# Patient Record
Sex: Female | Born: 1995
Health system: Southern US, Community
[De-identification: ages and names within clinical notes are randomized; demographics above are authoritative.]

## PROBLEM LIST (undated history)

## (undated) DIAGNOSIS — D649 Anemia, unspecified: Secondary | ICD-10-CM

## (undated) HISTORY — DX: Anemia, unspecified: D64.9

---
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
## 2014-04-07 LAB — OB RESULTS CONSOLE RPR: RPR: NONREACTIVE

## 2014-04-07 LAB — OB RESULTS CONSOLE HIV ANTIBODY (ROUTINE TESTING): HIV: NONREACTIVE

## 2014-04-07 LAB — OB RESULTS CONSOLE ABO/RH: RH Type: NEGATIVE

## 2014-04-07 LAB — OB RESULTS CONSOLE GC/CHLAMYDIA
Chlamydia: NEGATIVE
GC PROBE AMP, GENITAL: NEGATIVE

## 2014-04-07 LAB — OB RESULTS CONSOLE HEPATITIS B SURFACE ANTIGEN: Hepatitis B Surface Ag: NEGATIVE

## 2014-04-07 LAB — OB RESULTS CONSOLE RUBELLA ANTIBODY, IGM: RUBELLA: IMMUNE

## 2014-06-04 NOTE — L&D Delivery Note (Signed)
Delivery Note   At 4:51 AM a viable female  [name] was delivered via Vaginal, Spontaneous Delivery  (Presentation: ; Occiput Anterior).  APGAR: 9, 9 Placenta status:complete and spontaneous  Cord: 3 vessels   Anesthesia: Epidural  Episiotomy: None Lacerations: 2nd degree Suture Repair: 3.0 Monocryl Est. Blood Loss (mL):  200  Mom to postpartum.  Baby to Couplet care / Skin to Skin.  [name] A 10/15/2014, 5:21 AM

## 2014-09-08 LAB — OB RESULTS CONSOLE GBS: STREP GROUP B AG: NEGATIVE

## 2014-10-14 ENCOUNTER — Other Ambulatory Visit: Payer: Self-pay | Admitting: Obstetrics and Gynecology

## 2014-10-14 ENCOUNTER — Inpatient Hospital Stay (HOSPITAL_COMMUNITY)
Admission: AD | Admit: 2014-10-14 | Discharge: 2014-10-17 | DRG: 775 | Disposition: A | Discharge status: Home / Self Care | Payer: 59 | Source: Ambulatory Visit | Attending: Obstetrics and Gynecology | Admitting: Obstetrics and Gynecology

## 2014-10-14 ENCOUNTER — Encounter (HOSPITAL_COMMUNITY): Payer: Self-pay | Admitting: *Deleted

## 2014-10-14 DIAGNOSIS — Z3403 Encounter for supervision of normal first pregnancy, third trimester: Secondary | ICD-10-CM | POA: Diagnosis present

## 2014-10-14 DIAGNOSIS — O480 Post-term pregnancy: Principal | ICD-10-CM | POA: Diagnosis present

## 2014-10-14 DIAGNOSIS — O9902 Anemia complicating childbirth: Secondary | ICD-10-CM | POA: Diagnosis present

## 2014-10-14 DIAGNOSIS — Z3A41 41 weeks gestation of pregnancy: Secondary | ICD-10-CM | POA: Diagnosis present

## 2014-10-14 DIAGNOSIS — O26899 Other specified pregnancy related conditions, unspecified trimester: Secondary | ICD-10-CM | POA: Diagnosis present

## 2014-10-14 DIAGNOSIS — D649 Anemia, unspecified: Secondary | ICD-10-CM | POA: Diagnosis present

## 2014-10-14 DIAGNOSIS — Z6791 Unspecified blood type, Rh negative: Secondary | ICD-10-CM | POA: Diagnosis present

## 2014-10-14 DIAGNOSIS — Z889 Allergy status to unspecified drugs, medicaments and biological substances status: Secondary | ICD-10-CM

## 2014-10-14 DIAGNOSIS — F988 Other specified behavioral and emotional disorders with onset usually occurring in childhood and adolescence: Secondary | ICD-10-CM | POA: Diagnosis present

## 2014-10-14 LAB — CBC
HCT: 34.4 % — ABNORMAL LOW (ref 36.0–46.0)
HEMOGLOBIN: 11.4 g/dL — AB (ref 12.0–15.0)
MCH: 26.5 pg (ref 26.0–34.0)
MCHC: 33.1 g/dL (ref 30.0–36.0)
MCV: 79.8 fL (ref 78.0–100.0)
Platelets: 299 10*3/uL (ref 150–400)
RBC: 4.31 MIL/uL (ref 3.87–5.11)
RDW: 14.1 % (ref 11.5–15.5)
WBC: 12.4 10*3/uL — ABNORMAL HIGH (ref 4.0–10.5)

## 2014-10-14 LAB — TYPE AND SCREEN
ABO/RH(D): O NEG
Antibody Screen: NEGATIVE

## 2014-10-14 MED ORDER — LACTATED RINGERS IV SOLN
500.0000 mL | INTRAVENOUS | Status: DC | PRN
  Administered 2014-10-15: 400 mL via INTRAVENOUS

## 2014-10-14 MED ORDER — OXYTOCIN BOLUS FROM INFUSION: 500.0000 mL | INTRAVENOUS | Status: DC

## 2014-10-14 MED ORDER — OXYCODONE-ACETAMINOPHEN 5-325 MG PO TABS
1.0000 | ORAL_TABLET | ORAL | Status: DC | PRN
  Filled 2014-10-14: qty 1

## 2014-10-14 MED ORDER — FLEET ENEMA 7-19 GM/118ML RE ENEM: 1.0000 | ENEMA | RECTAL | Status: DC | PRN

## 2014-10-14 MED ORDER — ONDANSETRON HCL 4 MG/2ML IJ SOLN: 4.0000 mg | Freq: Four times a day (QID) | INTRAMUSCULAR | Status: DC | PRN

## 2014-10-14 MED ORDER — BUTORPHANOL TARTRATE 1 MG/ML IJ SOLN
1.0000 mg | INTRAMUSCULAR | Status: DC | PRN
  Administered 2014-10-15: 1 mg via INTRAVENOUS
  Filled 2014-10-14: qty 1

## 2014-10-14 MED ORDER — OXYCODONE-ACETAMINOPHEN 5-325 MG PO TABS: 2.0000 | ORAL_TABLET | ORAL | Status: DC | PRN

## 2014-10-14 MED ORDER — CITRIC ACID-SODIUM CITRATE 334-500 MG/5ML PO SOLN: 30.0000 mL | ORAL | Status: DC | PRN

## 2014-10-14 MED ORDER — LIDOCAINE HCL (PF) 1 % IJ SOLN
30.0000 mL | INTRAMUSCULAR | Status: DC | PRN
  Administered 2014-10-15: 30 mL via SUBCUTANEOUS
  Filled 2014-10-14: qty 30

## 2014-10-14 MED ORDER — LACTATED RINGERS IV SOLN
INTRAVENOUS | Status: DC
  Administered 2014-10-15: 04:00:00 via INTRAVENOUS

## 2014-10-14 MED ORDER — ACETAMINOPHEN 325 MG PO TABS: 650.0000 mg | ORAL_TABLET | ORAL | Status: DC | PRN

## 2014-10-14 MED ORDER — OXYTOCIN 40 UNITS IN LACTATED RINGERS INFUSION - SIMPLE MED
62.5000 mL/h | INTRAVENOUS | Status: DC
  Filled 2014-10-14: qty 1000

## 2014-10-14 NOTE — Progress Notes (Signed)
Lori SnookDanielle Perry MRN: [id number]  Subjective: -Patient resting in bed.  Mother, Gigi Gineggy, and FOB, Garald BraverKenrick, at bedside.  Patient reports intermittent lower abdominal pain.  Objective: BP 109/73 mmHg  Pulse 114  Temp(Src) 98.4 F (36.9 C) (Oral)  Resp 18  Ht 5\' 5"  (1.651 m)  Wt 70.761 kg (156 lb)  BMI 25.96 kg/m2     FHT: 150 bpm, Mod Var, -Decels, +Accels UC: Q1-474min, palpates moderate   SVE:   Dilation: 3 Effacement (%): 80 Station: -2 Exam by:: Manfred ArchV Latham CNM Membranes:Intact Pitocin:None  Assessment:  IUP at 41.2wks Cat I FT  PostDates GBS Negative  Plan: -Discussed POC for tonight including VE and AROM at 10pm -Informed of IV pain medication availability -No questions or concerns -Continue other mgmt as ordered  Hildreth Orsak LYNN,MSN, CNM 10/14/2014, 7:47 PM

## 2014-10-14 NOTE — H&P (Signed)
Lori Perry is a 19 y.o. female, G1P0 at 8441 1/7 weeks, presenting from the office in early labor--had reactive NST there around 4pm, with UCs q 3-10 min noted, cervix 3 cm, 90%, vtx, -1.    Patient Active Problem List   Diagnosis Date Noted  . Normal labor 10/14/2014  . 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 ADD (attention deficit disorder) 10/14/2014  . 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 Anemia 10/14/2014    History of present pregnancy: Patient entered care at 13 6/7 weeks.   EDC of 10/05/14 was established by US at 17 6/7 weeks, due to 10 day discrepancy in LMP and US dating.   Anatomy scan:  19 2/7 weeks, with normal findings and an low lying placenta.   Additional US evaluations:   28 2/7 weeks:  EFW 2+9, 28.8%ile, LLP resolved, normal fluid 34 2/7 weeks:  EFW 5+5, 47.4%ile, anterior placenta, AFI 14.37, 50%ile, vtx.   Significant prenatal events:  Had LLP on anatomy US, resolved on 28 week US.  100K Ecoli and enterococcus noted on NOB urine culture, with Macrobid Rx'd--patient had rxn to Macrobid with swelling.  Retreated with Ampicillin--no TOC done.  On Fe during pregnancy due to Hgb 10.9 at 28 weeks.  Received Rhophylac 07/29/14 and TDAP 07/08/14. Last evaluation:  10/14/14--cervix 3 cm, 90%, vtx, -1, BP 132/92.  NST was reactive, with UCs q 3 min.  [facility]  OB History    Gravida Para Term Preterm AB TAB SAB Ectopic Multiple Living   1              Medical Hx:  ADD dx age 89.  Occasional urinary frequency. Surgical Hx:  Wisdom teeth Family History:  MGF CVA, prostate Ca; Mother HTN, asthma; MGM cancer; PGM cancer  Social History:  has no tobacco, alcohol, and drug history on file.  Patient is Caucasian, of the [place] faith, high school educated.  She is single, with FOB Helane Gunther(Kenrick Gariba) involved and supportive.  Patient is employed as a Conservation officer, naturecashier.   Prenatal Transfer Tool  Maternal Diabetes: No Genetic Screening: Normal Quad screen Maternal  Ultrasounds/Referrals: Normal Fetal Ultrasounds or other Referrals:  None Maternal Substance Abuse:  No Significant Maternal Medications:  None Significant Maternal Lab Results: Lab values include: Group B Strep negative, Rh negative  TDAP 07/08/14 Flu NA  ROS:  Contractions, +FM  Allergies  Allergen Reactions  . Sulfa Antibiotics Rash     Dilation: 3 Effacement (%): 80 Station: -2 Exam by:: V Iliza Blankenbeckler CNM Blood pressure 95/77, pulse 84, temperature 98.2 F (36.8 C), temperature source Oral, resp. rate 20, height 5\' 5"  (1.651 m), weight 70.761 kg (156 lb).  Chest clear Heart RRR without murmur Abd gravid, NT, FH 39 Pelvic: 3 cm, 80%, vtx, -2, IBOW, small amount bloody show Ext: WNL  FHR: Category 2 in first 5 min--no decels, decreased variability UCs:  Irregular, mild  Prenatal labs: ABO, Rh: --/--/O NEG (05/12 1925)O neg Antibody: NEG (05/12 1925)Neg Rubella:   Immune RPR: Nonreactive (11/04 0000) NR HBsAg: Negative (11/04 0000)Neg  HIV: Non-reactive (11/04 0000) NR GBS: Negative (04/06 0000) Sickle cell/Hgb electrophoresis:  NA Pap:  NA due to age GC:  Negative 04/08/15 and 09/08/14 Chlamydia:  Negative 04/08/15 and 09/08/14 Genetic screenings:  Normal Quad screen Glucola:  WNL Other:   Hgb 12.9 at NOB, 10.3 at 28 weeks       Assessment/Plan: IUP at 41 1/7 weeks Early labor GBS negative Rh negative  [facility] Dr. Su Hiltoberts Routine [facility] orders Pain med/epidural prn--prefers IV med only.   Nyra CapesLATHAM, VICKICNM, [place] 10/14/2014, 9:15 PM

## 2014-10-14 NOTE — Progress Notes (Signed)
Daleen SnookDanielle Keasling MRN: [id number]  Subjective: -Patient reporting discomfort with contractions.  Declines IV pain medication at current.  Mother and FOB remain supportive at bedside.  Objective: BP 95/77 mmHg  Pulse 84  Temp(Src) 98.2 F (36.8 C) (Oral)  Resp 20  Ht 5\' 5"  (1.651 m)  Wt 70.761 kg (156 lb)  BMI 25.96 kg/m2     FHT: 150 bpm, Mod Var, -Decels, +Accels UC: Q3-645min, palpates moderate   SVE:   Dilation: 3 Effacement (%): 90 Station: -2 Exam by:: Caylin Nass CNM Membranes:AROM at 2300--Moderate Amt Green Meconium Pitocin:None  Assessment:  IUP at 41.2wks Cat I FT  Early Labor Augmentation: AROM  Plan: -Discussed pain medication options -Patient informed of need for monitoring for 30minutes before ambulation -Continue other mgmt as ordered  Letrell Attwood LYNN,MSN, CNM 10/14/2014, 11:08 PM

## 2014-10-15 ENCOUNTER — Encounter (HOSPITAL_COMMUNITY): Payer: Self-pay | Admitting: *Deleted

## 2014-10-15 ENCOUNTER — Inpatient Hospital Stay (HOSPITAL_COMMUNITY): Payer: 59 | Admitting: Anesthesiology

## 2014-10-15 LAB — ABO/RH: ABO/RH(D): O NEG

## 2014-10-15 LAB — RPR: RPR Ser Ql: NONREACTIVE

## 2014-10-15 LAB — HIV ANTIBODY (ROUTINE TESTING W REFLEX): HIV SCREEN 4TH GENERATION: NONREACTIVE

## 2014-10-15 MED ORDER — DIPHENHYDRAMINE HCL 50 MG/ML IJ SOLN: 12.5000 mg | INTRAMUSCULAR | Status: DC | PRN

## 2014-10-15 MED ORDER — OXYTOCIN 40 UNITS IN LACTATED RINGERS INFUSION - SIMPLE MED: 62.5000 mL/h | INTRAVENOUS | Status: DC | PRN

## 2014-10-15 MED ORDER — ONDANSETRON HCL 4 MG PO TABS: 4.0000 mg | ORAL_TABLET | ORAL | Status: DC | PRN

## 2014-10-15 MED ORDER — OXYCODONE-ACETAMINOPHEN 5-325 MG PO TABS
1.0000 | ORAL_TABLET | ORAL | Status: DC | PRN
  Administered 2014-10-15 – 2014-10-17 (×6): 1 via ORAL
  Filled 2014-10-15 (×5): qty 1

## 2014-10-15 MED ORDER — OXYTOCIN 40 UNITS IN LACTATED RINGERS INFUSION - SIMPLE MED
INTRAVENOUS | Status: AC
  Filled 2014-10-15: qty 1000

## 2014-10-15 MED ORDER — FENTANYL 2.5 MCG/ML BUPIVACAINE 1/10 % EPIDURAL INFUSION (WH - ANES)
14.0000 mL/h | INTRAMUSCULAR | Status: DC | PRN
Start: 2014-10-15 — End: 2014-10-15

## 2014-10-15 MED ORDER — PRENATAL MULTIVITAMIN CH
1.0000 | ORAL_TABLET | Freq: Every day | ORAL | Status: DC
  Administered 2014-10-15 – 2014-10-17 (×3): 1 via ORAL
  Filled 2014-10-15 (×3): qty 1

## 2014-10-15 MED ORDER — BENZOCAINE-MENTHOL 20-0.5 % EX AERO
1.0000 "application " | INHALATION_SPRAY | CUTANEOUS | Status: DC | PRN
  Filled 2014-10-15 (×3): qty 56

## 2014-10-15 MED ORDER — FENTANYL 2.5 MCG/ML BUPIVACAINE 1/10 % EPIDURAL INFUSION (WH - ANES)
14.0000 mL/h | INTRAMUSCULAR | Status: DC | PRN
  Administered 2014-10-15 (×2): 14 mL/h via EPIDURAL

## 2014-10-15 MED ORDER — SIMETHICONE 80 MG PO CHEW: 80.0000 mg | CHEWABLE_TABLET | ORAL | Status: DC | PRN

## 2014-10-15 MED ORDER — WITCH HAZEL-GLYCERIN EX PADS: 1.0000 "application " | MEDICATED_PAD | CUTANEOUS | Status: DC | PRN

## 2014-10-15 MED ORDER — LANOLIN HYDROUS EX OINT: TOPICAL_OINTMENT | CUTANEOUS | Status: DC | PRN

## 2014-10-15 MED ORDER — PHENYLEPHRINE 40 MCG/ML (10ML) SYRINGE FOR IV PUSH (FOR BLOOD PRESSURE SUPPORT)
PREFILLED_SYRINGE | INTRAVENOUS | Status: AC
  Filled 2014-10-15: qty 20

## 2014-10-15 MED ORDER — IBUPROFEN 600 MG PO TABS
600.0000 mg | ORAL_TABLET | Freq: Four times a day (QID) | ORAL | Status: DC
  Administered 2014-10-15 – 2014-10-17 (×9): 600 mg via ORAL
  Filled 2014-10-15 (×9): qty 1

## 2014-10-15 MED ORDER — ZOLPIDEM TARTRATE 5 MG PO TABS: 5.0000 mg | ORAL_TABLET | Freq: Every evening | ORAL | Status: DC | PRN

## 2014-10-15 MED ORDER — DIBUCAINE 1 % RE OINT: 1.0000 "application " | TOPICAL_OINTMENT | RECTAL | Status: DC | PRN

## 2014-10-15 MED ORDER — LIDOCAINE HCL (PF) 1 % IJ SOLN
INTRAMUSCULAR | Status: DC | PRN
  Administered 2014-10-15: 4 mL
  Administered 2014-10-15: 6 mL

## 2014-10-15 MED ORDER — TETANUS-DIPHTH-ACELL PERTUSSIS 5-2.5-18.5 LF-MCG/0.5 IM SUSP: 0.5000 mL | Freq: Once | INTRAMUSCULAR | Status: DC

## 2014-10-15 MED ORDER — PHENYLEPHRINE 40 MCG/ML (10ML) SYRINGE FOR IV PUSH (FOR BLOOD PRESSURE SUPPORT)
80.0000 ug | PREFILLED_SYRINGE | INTRAVENOUS | Status: DC | PRN
  Filled 2014-10-15: qty 2

## 2014-10-15 MED ORDER — SENNOSIDES-DOCUSATE SODIUM 8.6-50 MG PO TABS
2.0000 | ORAL_TABLET | ORAL | Status: DC
  Administered 2014-10-15 – 2014-10-16 (×2): 2 via ORAL
  Filled 2014-10-15 (×2): qty 2

## 2014-10-15 MED ORDER — FENTANYL 2.5 MCG/ML BUPIVACAINE 1/10 % EPIDURAL INFUSION (WH - ANES)
INTRAMUSCULAR | Status: AC
  Filled 2014-10-15: qty 125

## 2014-10-15 MED ORDER — ACETAMINOPHEN 325 MG PO TABS: 650.0000 mg | ORAL_TABLET | ORAL | Status: DC | PRN

## 2014-10-15 MED ORDER — DIPHENHYDRAMINE HCL 25 MG PO CAPS: 25.0000 mg | ORAL_CAPSULE | Freq: Four times a day (QID) | ORAL | Status: DC | PRN

## 2014-10-15 MED ORDER — FERROUS SULFATE 325 (65 FE) MG PO TABS
325.0000 mg | ORAL_TABLET | Freq: Two times a day (BID) | ORAL | Status: DC
Start: 2014-10-15 — End: 2014-10-17
  Administered 2014-10-15 – 2014-10-17 (×4): 325 mg via ORAL
  Filled 2014-10-15 (×4): qty 1

## 2014-10-15 MED ORDER — EPHEDRINE 5 MG/ML INJ
10.0000 mg | INTRAVENOUS | Status: DC | PRN
  Filled 2014-10-15: qty 2

## 2014-10-15 MED ORDER — ONDANSETRON HCL 4 MG/2ML IJ SOLN: 4.0000 mg | INTRAMUSCULAR | Status: DC | PRN

## 2014-10-15 MED ORDER — OXYCODONE-ACETAMINOPHEN 5-325 MG PO TABS: 2.0000 | ORAL_TABLET | ORAL | Status: DC | PRN

## 2014-10-15 MED ORDER — MEASLES, MUMPS & RUBELLA VAC ~~LOC~~ INJ
0.5000 mL | INJECTION | Freq: Once | SUBCUTANEOUS | Status: DC
  Filled 2014-10-15: qty 0.5

## 2014-10-15 NOTE — Anesthesia Postprocedure Evaluation (Signed)
Anesthesia Post Note  Patient: Lori Perry  Procedure(s) Performed: * No procedures listed *  Anesthesia type: Epidural  Patient location: Mother/Baby  Post pain: Pain level controlled  Post assessment: Post-op Vital signs reviewed  Last Vitals:  Filed Vitals:   10/15/14 1825  BP: 113/66  Pulse: 85  Temp: 36.7 C  Resp: 18    Post vital signs: Reviewed  Level of consciousness:alert  Complications: No apparent anesthesia complications

## 2014-10-15 NOTE — Progress Notes (Signed)
Dr Estanislado Pandyivard arrived at bedside.

## 2014-10-15 NOTE — Anesthesia Preprocedure Evaluation (Signed)

## 2014-10-15 NOTE — Progress Notes (Signed)
Patient complete. Requesting to rest.

## 2014-10-15 NOTE — Anesthesia Procedure Notes (Signed)

## 2014-10-16 ENCOUNTER — Encounter (HOSPITAL_COMMUNITY): Payer: Self-pay | Admitting: Obstetrics and Gynecology

## 2014-10-16 LAB — CBC
HEMATOCRIT: 28.8 % — AB (ref 36.0–46.0)
HEMOGLOBIN: 9.3 g/dL — AB (ref 12.0–15.0)
MCH: 26.1 pg (ref 26.0–34.0)
MCHC: 32.3 g/dL (ref 30.0–36.0)
MCV: 80.9 fL (ref 78.0–100.0)
Platelets: 223 10*3/uL (ref 150–400)
RBC: 3.56 MIL/uL — ABNORMAL LOW (ref 3.87–5.11)
RDW: 14.6 % (ref 11.5–15.5)
WBC: 12.5 10*3/uL — AB (ref 4.0–10.5)

## 2014-10-16 MED ORDER — RHO D IMMUNE GLOBULIN 1500 UNIT/2ML IJ SOSY
300.0000 ug | PREFILLED_SYRINGE | Freq: Once | INTRAMUSCULAR | Status: AC
  Administered 2014-10-16: 300 ug via INTRAVENOUS
  Filled 2014-10-16: qty 2

## 2014-10-16 NOTE — Progress Notes (Signed)
  Subjective: Postpartum Day 1: Vaginal delivery, 2nd degree perineal laceration Patient up ad lib, reports no syncope or dizziness. Feeding:  Breast Contraceptive plan:  Wants OCPs  Baby Rh +--mother awaiting Rhophylac today. Wants circ--will likely do as outpatient.  Objective: Vital signs in last 24 hours: Temp:  [97.6 F (36.4 C)-98.3 F (36.8 C)] 98.3 F (36.8 C) (05/14 0538) Pulse Rate:  [77-85] 79 (05/14 0538) Resp:  [16-18] 18 (05/14 0538) BP: (96-113)/(48-66) 96/48 mmHg (05/14 0538) SpO2:  [99 %-100 %] 100 % (05/13 2000)  Physical Exam:  General: alert Lochia: appropriate Uterine Fundus: firm Perineum: healing well DVT Evaluation: No evidence of DVT seen on physical exam. Negative Homan's sign.  CBC Latest Ref Rng 10/16/2014 10/14/2014 07/14/2010  WBC 4.0 - 10.5 K/uL 12.5(H) 12.4(H) 9.4  Hemoglobin 12.0 - 15.0 g/dL 1.6(X9.3(L) 11.4(L) 12.5  Hematocrit 36.0 - 46.0 % 28.8(L) 34.4(L) 37.3  Platelets 150 - 400 K/uL 223 299 315     Assessment/Plan: Status post vaginal delivery day 1. Stable Continue current care. Plan for discharge tomorrow  Reviewed OCP use, with need for progesterone only pill due to breastfeeding.    Nyra CapesLATHAM, VICKICNM 10/16/2014, 9:32 AM

## 2014-10-17 LAB — RH IG WORKUP (INCLUDES ABO/RH)
ABO/RH(D): O NEG
Fetal Screen: NEGATIVE
GESTATIONAL AGE(WKS): 41
UNIT DIVISION: 0

## 2014-10-17 MED ORDER — NORETHINDRONE 0.35 MG PO TABS: 1.0000 | ORAL_TABLET | Freq: Every day | ORAL | Status: DC

## 2014-10-17 MED ORDER — LEVONORGESTREL-ETHINYL ESTRAD 0.1-20 MG-MCG PO TABS: 1.0000 | ORAL_TABLET | Freq: Every day | ORAL | Status: DC

## 2014-10-17 MED ORDER — OXYCODONE-ACETAMINOPHEN 5-325 MG PO TABS: 1.0000 | ORAL_TABLET | ORAL | Status: DC | PRN

## 2014-10-17 MED ORDER — FERROUS SULFATE 325 (65 FE) MG PO TABS: 325.0000 mg | ORAL_TABLET | Freq: Every day | ORAL | Status: DC

## 2014-10-17 MED ORDER — IBUPROFEN 600 MG PO TABS: 600.0000 mg | ORAL_TABLET | Freq: Four times a day (QID) | ORAL | Status: DC | PRN

## 2014-10-17 NOTE — Lactation Note (Signed)
This note was copied from the chart of Lori Perry Belding. Lactation Consultation Note  Patient Name: Lori Perry Ezelle QIONG'EToday's Date: 10/17/2014 Reason for consult: Follow-up assessment  Mom has decided to change to formula/bottle feeding. Does not want to pump/bottle. Reviewed how to dry her milk should it come in. Advised of OP services.  Maternal Data    Feeding Feeding Type: Bottle Fed - Formula Nipple Type: Slow - flow  LATCH Score/Interventions                      Lactation Tools Discussed/Used     Consult Status Consult Status: Complete Date: 10/17/14 Follow-up type: In-patient    Alfred LevinsGranger, Everlina Gotts Ann 10/17/2014, 11:05 AM

## 2014-10-17 NOTE — Discharge Instructions (Signed)

## 2014-10-17 NOTE — Discharge Summary (Signed)
  Vaginal Delivery Discharge Summary  Lori SnookDanielle Perry  DOB:    01/05/1996 MRN:    [id number] CSN:    [id number]  Date of admission:                  10/14/14  Date of discharge:                   10/17/14  Procedures this admission:   SVB, repair of 2nd degree laceration  Date of Delivery: 10/15/14  Newborn Data:  Live born female  Birth Weight: 8 lb 4.2 oz (3748 g) APGAR: 9, 9  Home with mother. Name: Erskine SquibbKaiden Circumcision Plan: Outpatient  History of Present Illness:  Ms. Lori SnookDanielle Belle is a 19 y.o. female, G1P1001, who presents at 5267w3d weeks gestation. [facility] She was admitted onset of labor. Her pregnancy has been complicated by:  Patient Active Problem List   Diagnosis Date Noted  . Vaginal delivery 10/15/2014  . 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 ADD (attention deficit disorder) 10/14/2014  . 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 Anemia 10/14/2014     Hospital Course:  Admitted 10/14/14 in early labor.  Negative GBS. Progressed with AROM Utilized epidural for pain management.  Delivery was performed by Dr. Estanislado Pandyivard without complication. Patient and baby tolerated the procedure without difficulty, with 2nd degree perineal laceration noted. Infant status was stable and remained in room with mother.  Mother and infant then had an uncomplicated postpartum course, with patient initially planning to breastfeed, but then deciding to bottle feed. Mom's physical exam was WNL, and she was discharged home in stable condition. Contraception plan was Micronor.  She received adequate benefit from po pain medications, using Motrin and Percocet, also Rx'd Fe q day.  Rx for Orsythia OCP .   Feeding:  breast  Contraception:  oral contraceptives (estrogen/progesterone)  Hemoglobin Results:  CBC Latest Ref Rng 10/16/2014 10/14/2014 07/14/2010  WBC 4.0 - 10.5 K/uL  12.5(H) 12.4(H) 9.4  Hemoglobin 12.0 - 15.0 g/dL 7.8(G9.3(L) 11.4(L) 12.5  Hematocrit 36.0 - 46.0 % 28.8(L) 34.4(L) 37.3  Platelets 150 - 400 K/uL 223 299 315     Discharge Physical Exam:   General: alert Lochia: appropriate Uterine Fundus: firm Incision: healing well DVT Evaluation: No evidence of DVT seen on physical exam. Negative Homan's sign.  Intrapartum Procedures: spontaneous vaginal delivery Postpartum Procedures: Rho(D) Ig Complications-Operative and Postpartum: 2nd degree perineal laceration  Discharge Diagnoses: Term Pregnancy-delivered and anemia  Discharge Information:  Activity:           pelvic rest Diet:                routine Medications: Ibuprofen, Iron, Percocet and Orsythia OCP Condition:      stable Instructions:     Discharge to: home  Follow-up Information    [facility]. Schedule an appointment as soon as possible for a visit in 6 weeks.   Specialty:  Obstetrics and Gynecology   Why:  Call [facility] to schedule baby's circumcision.  Call for any questions or concerns.   Contact information:   [address] [place] [phone]       Nigel BridgemanLATHAM, Bawi Lakins [facility] 10/17/2014 10:28 AM

## 2014-10-17 NOTE — Progress Notes (Signed)
While speaking with patient regarding breastfeeding or pumping and putting breast milk in bottle, she became tearful and told me that she does not want to do any of it; only formula feed.  Education had been provided regarding how often to pump or latch, engorgement treatment if she became engorged, etc.  Patient was provided emotional support and was reassured that her decision was ok.  Will continue to monitor and provide support

## 2015-04-26 ENCOUNTER — Emergency Department (HOSPITAL_BASED_OUTPATIENT_CLINIC_OR_DEPARTMENT_OTHER): Payer: 59

## 2015-04-26 ENCOUNTER — Encounter (INDEPENDENT_AMBULATORY_CARE_PROVIDER_SITE_OTHER): Payer: Self-pay

## 2015-04-26 ENCOUNTER — Emergency Department (HOSPITAL_BASED_OUTPATIENT_CLINIC_OR_DEPARTMENT_OTHER)
Admission: EM | Admit: 2015-04-26 | Discharge: 2015-04-26 | Disposition: A | Discharge status: Home / Self Care | Payer: 59 | Attending: Emergency Medicine | Admitting: Emergency Medicine

## 2015-04-26 ENCOUNTER — Emergency Department: Payer: 59

## 2015-04-26 ENCOUNTER — Encounter (HOSPITAL_BASED_OUTPATIENT_CLINIC_OR_DEPARTMENT_OTHER): Payer: Self-pay | Admitting: Emergency Medicine

## 2015-04-26 DIAGNOSIS — Z793 Long term (current) use of hormonal contraceptives: Secondary | ICD-10-CM | POA: Insufficient documentation

## 2015-04-26 DIAGNOSIS — S92402A Displaced unspecified fracture of left great toe, initial encounter for closed fracture: Secondary | ICD-10-CM

## 2015-04-26 DIAGNOSIS — Y998 Other external cause status: Secondary | ICD-10-CM | POA: Diagnosis not present

## 2015-04-26 DIAGNOSIS — S0003XA Contusion of scalp, initial encounter: Secondary | ICD-10-CM | POA: Diagnosis not present

## 2015-04-26 DIAGNOSIS — Y939 Activity, unspecified: Secondary | ICD-10-CM | POA: Diagnosis not present

## 2015-04-26 DIAGNOSIS — S060X1A Concussion with loss of consciousness of 30 minutes or less, initial encounter: Secondary | ICD-10-CM | POA: Insufficient documentation

## 2015-04-26 DIAGNOSIS — Y92009 Unspecified place in unspecified non-institutional (private) residence as the place of occurrence of the external cause: Secondary | ICD-10-CM | POA: Insufficient documentation

## 2015-04-26 DIAGNOSIS — S0990XA Unspecified injury of head, initial encounter: Secondary | ICD-10-CM | POA: Diagnosis present

## 2015-04-26 DIAGNOSIS — S92412A Displaced fracture of proximal phalanx of left great toe, initial encounter for closed fracture: Secondary | ICD-10-CM | POA: Diagnosis not present

## 2015-04-26 DIAGNOSIS — W19XXXA Unspecified fall, initial encounter: Secondary | ICD-10-CM | POA: Insufficient documentation

## 2015-04-26 DIAGNOSIS — S90412A Abrasion, left great toe, initial encounter: Secondary | ICD-10-CM | POA: Diagnosis not present

## 2015-04-26 DIAGNOSIS — Z79899 Other long term (current) drug therapy: Secondary | ICD-10-CM | POA: Diagnosis not present

## 2015-04-26 DIAGNOSIS — Z880 Allergy status to penicillin: Secondary | ICD-10-CM | POA: Diagnosis not present

## 2015-04-26 MED ORDER — ACETAMINOPHEN 325 MG PO TABS
650.0000 mg | ORAL_TABLET | Freq: Once | ORAL | Status: AC
  Administered 2015-04-26: 650 mg via ORAL
  Filled 2015-04-26: qty 2

## 2015-04-26 NOTE — ED Notes (Signed)
Pt found on the floor at home at approx 8:30 pm by boyfriend, who had been in a different part of the house.  Pt has no recollection of the incident.  Boyfriend is not present.  Pt brought in by mother.  Pt does not know if she passed out and fell or if she fell and hit her head and passed out. C/O pain and tenderness to right temple area. Also pain to left great toe. Pt vomited 2-3 times after her fall. No vomiting at this time.

## 2015-04-26 NOTE — ED Provider Notes (Signed)
CSN: [id number]     Arrival date & time 04/26/15  0209 History   First MD Initiated Contact with Patient 04/26/15 0248     Chief Complaint  Patient presents with  . Fall     (Consider location/radiation/quality/duration/timing/severity/associated sxs/prior Treatment) HPI  This is a 19 year old female who either fell or passed out about 8:15 yesterday evening. She was found by her boyfriend. When he found her she was tremulous but conscious. She does not remember the fall. She subsequently vomited twice but is no longer vomiting or nauseated. She is complaining of a moderate headache with a tender right parietal hematoma. She also has ecchymosis, pain and abrasions to the left great toe. She has had difficulty answering certain questions such as day and date. She denies neck pain or other injury apart from those noted above.    Past Medical History  Diagnosis Date  . Normal labor 10/14/2014   History reviewed. No pertinent past surgical history. No family history on file. Social History  Substance Use Topics  . Smoking status: Never Smoker   . Smokeless tobacco: None  . Alcohol Use: No   OB History    Gravida Para Term Preterm AB TAB SAB Ectopic Multiple Living   1 1 1       0 1     Review of Systems  All other systems reviewed and are negative.   Allergies  Amoxicillin and Sulfa antibiotics  Home Medications   Prior to Admission medications   Medication Sig Start Date End Date Taking? Authorizing Provider  ferrous sulfate 325 (65 FE) MG tablet Take 1 tablet (325 mg total) by mouth daily with breakfast. 10/17/14   Nigel BridgemanVicki Latham, CNM  ibuprofen (ADVIL,MOTRIN) 600 MG tablet Take 1 tablet (600 mg total) by mouth every 6 (six) hours as needed. 10/17/14   Nigel BridgemanVicki Latham, CNM  levonorgestrel-ethinyl estradiol (ORSYTHIA) 0.1-20 MG-MCG tablet Take 1 tablet by mouth daily. 10/17/14   Nigel BridgemanVicki Latham, CNM  oxyCODONE-acetaminophen (PERCOCET/ROXICET) 5-325 MG per tablet Take 1 tablet by mouth  every 4 (four) hours as needed (for pain scale 4-7). 10/17/14   Nigel BridgemanVicki Latham, CNM  Prenatal Vit-Fe Fumarate-FA (PRENATAL MULTIVITAMIN) TABS tablet Take 1 tablet by mouth daily at 12 noon.    Historical Provider, MD   BP 103/61 mmHg  Pulse 98  Temp(Src) 98.3 F (36.8 C) (Oral)  Resp 18  Ht 5\' 6"  (1.676 m)  Wt 138 lb (62.596 kg)  BMI 22.28 kg/m2  SpO2 100%  LMP  (Approximate)  Breastfeeding? No   Physical Exam  General: Well-developed, well-nourished female in no acute distress; appearance consistent with age of record HENT: normocephalic; small right parietal hematoma; no hemotympanum Eyes: pupils equal, round and reactive to light; extraocular muscles intact Neck: supple; no C-spine tenderness Heart: regular rate and rhythm Lungs: clear to auscultation bilaterally Abdomen: soft; nondistended; nontender; bowel sounds present Back: No spinal tenderness Extremities: No deformity; full range of motion; pulses normal; tenderness, ecchymosis and abrasions to left great toe:   Neurologic: Awake, alert and oriented to person, place and year, disoriented to day, date and month, had to be reoriented to current POTUS and POTUS-elect; motor function intact in all extremities and symmetric; no facial droop Skin: Warm and dry Psychiatric: Flat affect    ED Course  Procedures (including critical care time)   MDM  Nursing notes and vitals signs, including pulse oximetry, reviewed.  Summary of this visit's results, reviewed by myself:   EKG Interpretation  Date/Time:  Tuesday April 26 2015 04:10:23 EST Ventricular Rate:  86 PR Interval:  112 QRS Duration: 82 QT Interval:  360 QTC Calculation: 430 R Axis:   78 Text Interpretation:  Normal sinus rhythm Normal ECG No previous ECGs available Confirmed by Read Drivers  MD, Jonny Ruiz ([id number]) on 04/26/2015 4:13:24 AM       Imaging Studies: Ct Head Wo Contrast  04/26/2015  CLINICAL DATA:  Acute onset of right temporal headache and confusion.  Question of fall. Initial encounter. EXAM: CT HEAD WITHOUT CONTRAST TECHNIQUE: Contiguous axial images were obtained from the base of the skull through the vertex without intravenous contrast. COMPARISON:  Maxillofacial CT performed 07/14/2010 FINDINGS: There is no evidence of acute infarction, mass lesion, or intra- or extra-axial hemorrhage on CT. The posterior fossa, including the cerebellum, brainstem and fourth ventricle, is within normal limits. The third and lateral ventricles, and basal ganglia are unremarkable in appearance. The cerebral hemispheres are symmetric in appearance, with normal gray-white differentiation. No mass effect or midline shift is seen. There is no evidence of fracture; visualized osseous structures are unremarkable in appearance. The visualized portions of the orbits are within normal limits. The paranasal sinuses and mastoid air cells are well-aerated. No significant soft tissue abnormalities are seen. IMPRESSION: Unremarkable noncontrast CT of the head. Electronically Signed   By: Roanna Raider M.D.   On: 04/26/2015 03:38   Dg Toe Great Left  04/26/2015  CLINICAL DATA:  Left great toe pain, of unknown duration. Initial encounter. EXAM: LEFT GREAT TOE COMPARISON:  None. FINDINGS: There appears to be a minimally displaced fracture at the medial aspect of the base of the first proximal phalanx. Mild overlying soft tissue swelling is suggested. No additional fractures are seen. Remaining visualized joint spaces are preserved. IMPRESSION: Apparent minimally displaced fracture at the medial aspect of the base of the first proximal phalanx. Electronically Signed   By: Roanna Raider M.D.   On: 04/26/2015 03:48   4:03 AM The patient has had some improvement in memory since arrival. It is unclear exactly what happened but the injury to her toe and head injury suggests she stubbed her toe and fell causing a closed head injury with brief LOC and subsequent post-concussive  symptoms.    Paula Libra, MD 04/26/15 [phone]

## 2015-05-07 ENCOUNTER — Emergency Department (HOSPITAL_COMMUNITY)
Admission: EM | Admit: 2015-05-07 | Discharge: 2015-05-07 | Disposition: A | Discharge status: Home / Self Care | Payer: 59 | Attending: Emergency Medicine | Admitting: Emergency Medicine

## 2015-05-07 ENCOUNTER — Encounter (HOSPITAL_COMMUNITY): Payer: Self-pay | Admitting: Emergency Medicine

## 2015-05-07 DIAGNOSIS — R51 Headache: Secondary | ICD-10-CM | POA: Diagnosis present

## 2015-05-07 DIAGNOSIS — Z3202 Encounter for pregnancy test, result negative: Secondary | ICD-10-CM | POA: Insufficient documentation

## 2015-05-07 DIAGNOSIS — Z79899 Other long term (current) drug therapy: Secondary | ICD-10-CM | POA: Diagnosis not present

## 2015-05-07 DIAGNOSIS — Z880 Allergy status to penicillin: Secondary | ICD-10-CM | POA: Insufficient documentation

## 2015-05-07 DIAGNOSIS — G44309 Post-traumatic headache, unspecified, not intractable: Secondary | ICD-10-CM | POA: Diagnosis not present

## 2015-05-07 LAB — CBC
HEMATOCRIT: 39.7 % (ref 36.0–46.0)
HEMOGLOBIN: 12.2 g/dL (ref 12.0–15.0)
MCH: 24.2 pg — ABNORMAL LOW (ref 26.0–34.0)
MCHC: 30.7 g/dL (ref 30.0–36.0)
MCV: 78.8 fL (ref 78.0–100.0)
Platelets: 370 10*3/uL (ref 150–400)
RBC: 5.04 MIL/uL (ref 3.87–5.11)
RDW: 15.6 % — AB (ref 11.5–15.5)
WBC: 6.2 10*3/uL (ref 4.0–10.5)

## 2015-05-07 LAB — COMPREHENSIVE METABOLIC PANEL
ALBUMIN: 4.1 g/dL (ref 3.5–5.0)
ALT: 17 U/L (ref 14–54)
ANION GAP: 8 (ref 5–15)
AST: 18 U/L (ref 15–41)
Alkaline Phosphatase: 71 U/L (ref 38–126)
BILIRUBIN TOTAL: 0.6 mg/dL (ref 0.3–1.2)
BUN: 8 mg/dL (ref 6–20)
CHLORIDE: 108 mmol/L (ref 101–111)
CO2: 24 mmol/L (ref 22–32)
Calcium: 9 mg/dL (ref 8.9–10.3)
Creatinine, Ser: 0.81 mg/dL (ref 0.44–1.00)
GFR calc Af Amer: >60 mL/min (ref >60)
GFR calc non Af Amer: >60 mL/min (ref >60)
GLUCOSE: 106 mg/dL — AB (ref 65–99)
POTASSIUM: 3.5 mmol/L (ref 3.5–5.1)
SODIUM: 140 mmol/L (ref 135–145)
TOTAL PROTEIN: 7.6 g/dL (ref 6.5–8.1)

## 2015-05-07 LAB — URINALYSIS, ROUTINE W REFLEX MICROSCOPIC
Glucose, UA: NEGATIVE mg/dL
Hgb urine dipstick: NEGATIVE
KETONES UR: NEGATIVE mg/dL
LEUKOCYTES UA: NEGATIVE
NITRITE: NEGATIVE
PH: 6 (ref 5.0–8.0)
Protein, ur: 30 mg/dL — AB
Specific Gravity, Urine: 1.031 — ABNORMAL HIGH (ref 1.005–1.030)

## 2015-05-07 LAB — URINE MICROSCOPIC-ADD ON

## 2015-05-07 LAB — LIPASE, BLOOD: LIPASE: 28 U/L (ref 11–51)

## 2015-05-07 LAB — POC URINE PREG, ED: Preg Test, Ur: NEGATIVE

## 2015-05-07 MED ORDER — KETOROLAC TROMETHAMINE 30 MG/ML IJ SOLN
30.0000 mg | Freq: Once | INTRAMUSCULAR | Status: AC
  Administered 2015-05-07: 30 mg via INTRAVENOUS
  Filled 2015-05-07: qty 1

## 2015-05-07 MED ORDER — SODIUM CHLORIDE 0.9 % IV BOLUS (SEPSIS)
1000.0000 mL | Freq: Once | INTRAVENOUS | Status: AC
  Administered 2015-05-07: 1000 mL via INTRAVENOUS

## 2015-05-07 MED ORDER — IBUPROFEN 800 MG PO TABS: 800.0000 mg | ORAL_TABLET | Freq: Three times a day (TID) | ORAL | Status: DC

## 2015-05-07 MED ORDER — ONDANSETRON HCL 4 MG/2ML IJ SOLN
4.0000 mg | Freq: Once | INTRAMUSCULAR | Status: AC
  Administered 2015-05-07: 4 mg via INTRAVENOUS
  Filled 2015-05-07: qty 2

## 2015-05-07 NOTE — ED Notes (Signed)
Pt. reports emesis x2 this evening with headache , she was advised to go to ER if she vomits due to concussion last 04/26/2015 , denies fever or chills.

## 2015-05-07 NOTE — Discharge Instructions (Signed)

## 2015-05-07 NOTE — ED Provider Notes (Signed)
CSN: [id number]     Arrival date & time 05/07/15  0038 History  By signing my name below, I, Lori Perry, attest that this documentation has been prepared under the direction and in the presence of [name], MD. Electronically Signed: [name], ED Scribe. 05/07/2015. 1:38 AM.   Chief Complaint  Patient presents with  . Emesis  . Headache   The history is provided by the patient. No language interpreter was used.    HPI Comments: Lori Perry is a 19 y.o. female s/p concussion on 04/26/15 after an unwitnessed fall who presents to the Emergency Department complaining of 3 episodes of emesis this evening with associated HA, nausea onset tonight. Pt does not remember the fall but believes she hit her head on the floor. Boyfriend reports that he found the pt on the floor at 8:30PM in a state of altered consciousness and was trembling. He states her breathing was altered and she had transient confusion and memory loss. He notes the memory loss improved 5 hours later. Pt was seen for the initial injury at [facility]. XR and MRI showed no acute findings. She also sustained fracture to her left foot, which has been healing well. Pt states occasional non-daily HA since the head injury. Boyfriend reports no memory issues since the initial injury. Pt is on birth control and is not concerned for pregnancy. She denies dysuria, frequency, cough, abdominal pain, dizziness, lightheadedness.   Past Medical History  Diagnosis Date  . Normal labor 10/14/2014   History reviewed. No pertinent past surgical history. No family history on file. Social History  Substance Use Topics  . Smoking status: Never Smoker   . Smokeless tobacco: None  . Alcohol Use: No   OB History    Gravida Para Term Preterm AB TAB SAB Ectopic Multiple Living   1 1 1       0 1     Review of Systems  Respiratory: Negative for cough.   Gastrointestinal: Positive for nausea and vomiting. Negative for abdominal pain.   Genitourinary: Negative for dysuria and frequency.  Neurological: Positive for headaches. Negative for dizziness and light-headedness.  All other systems reviewed and are negative.  Allergies  Amoxicillin and Sulfa antibiotics  Home Medications   Prior to Admission medications   Medication Sig Start Date End Date Taking? Authorizing Provider  levonorgestrel-ethinyl estradiol (ORSYTHIA) 0.1-20 MG-MCG tablet Take 1 tablet by mouth daily. 10/17/14  Yes Nigel BridgemanVicki Latham, CNM  ibuprofen (ADVIL,MOTRIN) 800 MG tablet Take 1 tablet (800 mg total) by mouth 3 (three) times daily. 05/07/15   Eber HongBrian Treyson Axel, MD   BP 106/68 mmHg  Pulse 79  Temp(Src) 98.1 F (36.7 C) (Oral)  Resp 16  Ht 5\' 4"  (1.626 m)  Wt 140 lb (63.504 kg)  BMI 24.02 kg/m2  SpO2 100%  LMP 04/08/2015 Physical Exam  Constitutional: She appears well-developed and well-nourished. No distress.  HENT:  Head: Normocephalic and atraumatic.  Mouth/Throat: Oropharynx is clear and moist. No oropharyngeal exudate.  Eyes: Conjunctivae and EOM are normal. Pupils are equal, round, and reactive to light. Right eye exhibits no discharge. Left eye exhibits no discharge. No scleral icterus.  Neck: Normal range of motion. Neck supple. No JVD present. No thyromegaly present.  Cardiovascular: Normal rate, regular rhythm, normal heart sounds and intact distal pulses.  Exam reveals no gallop and no friction rub.   No murmur heard. Pulmonary/Chest: Effort normal and breath sounds normal. No respiratory distress. She has no wheezes. She has  no rales.  Abdominal: Soft. Bowel sounds are normal. She exhibits no distension and no mass. There is no tenderness.  Musculoskeletal: Normal range of motion. She exhibits no edema or tenderness.  Lymphadenopathy:    She has no cervical adenopathy.  Neurological: She is alert. Coordination normal.  Neurologic exam:  Speech clear, pupils equal round reactive to light, extraocular movements intact  Normal peripheral  visual fields Cranial nerves III through XII normal including no facial droop Follows commands, moves all extremities x4, normal strength to bilateral upper and lower extremities at all major muscle groups including grip Sensation normal to light touch and pinprick Coordination intact, no limb ataxia, finger-nose-finger normal Rapid alternating movements normal No pronator drift Gait normal   Skin: Skin is warm and dry. No rash noted. No erythema.  Very superficial scabs on the dorsum of the left foot.   Psychiatric: She has a normal mood and affect. Her behavior is normal.  Nursing note and vitals reviewed.  ED Course  Procedures (including critical care time) DIAGNOSTIC STUDIES: Oxygen Saturation is 100% on RA, normal by my interpretation.    COORDINATION OF CARE: 1:35 AM Discussed treatment plan with pt at bedside and pt agreed to plan.   Labs Review Labs Reviewed  COMPREHENSIVE METABOLIC PANEL - Abnormal; Notable for the following:    Glucose, Bld 106 (*)    All other components within normal limits  CBC - Abnormal; Notable for the following:    MCH 24.2 (*)    RDW 15.6 (*)    All other components within normal limits  URINALYSIS, ROUTINE W REFLEX MICROSCOPIC (NOT AT [facility]) - Abnormal; Notable for the following:    Color, Urine AMBER (*)    APPearance CLOUDY (*)    Specific Gravity, Urine 1.031 (*)    Bilirubin Urine SMALL (*)    Protein, ur 30 (*)    All other components within normal limits  URINE MICROSCOPIC-ADD ON - Abnormal; Notable for the following:    Squamous Epithelial / LPF 6-30 (*)    Bacteria, UA FEW (*)    All other components within normal limits  LIPASE, BLOOD  POC URINE PREG, ED  I have personally reviewed and evaluated these images as part of my medical decision-making.  MDM   Final diagnoses:  Post-concussion headache    Headache gone after meds and fluids - no indication for repeat imaging - VS normal  Filed Vitals:   05/07/15 0042  05/07/15 0145 05/07/15 0230 05/07/15 0300  BP: 100/74 108/70 108/69 106/68  Pulse: 105 87 77 79  Temp: 98.1 F (36.7 C)     TempSrc: Oral     Resp: 18   16  Height:  (1.626 m)     Weight: 140 lb (63.504 kg)     SpO2: 100% 100% 100% 100%   Labs neg - no pregnancy, labs reviewed without acute findings  Meds given in ED:  Medications  sodium chloride 0.9 % bolus 1,000 mL (0 mLs Intravenous Stopped 05/07/15 0318)  ondansetron (ZOFRAN) injection 4 mg (4 mg Intravenous Given 05/07/15 0157)  ketorolac (TORADOL) 30 MG/ML injection 30 mg (30 mg Intravenous Given 05/07/15 0157)    New Prescriptions   IBUPROFEN (ADVIL,MOTRIN) 800 MG TABLET    Take 1 tablet (800 mg total) by mouth 3 (three) times daily.    I personally performed the services described in this documentation, which was scribed in my presence. The recorded information has been reviewed and is accurate.  Eber Hong, MD 05/07/15 [phone]

## 2015-06-23 MED FILL — LEVONOR-ETH ESTRAD 0.1-0.02: 0.1-20 | 84 days supply | Qty: 84 | Fill #3

## 2015-09-22 MED FILL — LEVONOR-ETH ESTRAD 0.1-0.02: 0.1-20 | 84 days supply | Qty: 84 | Fill #0

## 2015-12-13 MED FILL — LEVONOR-ETH ESTRAD 0.1-0.02: 0.1-20 | 84 days supply | Qty: 84 | Fill #1

## 2016-03-19 MED FILL — LEVONOR-ETH ESTRAD 0.1-0.02: 0.1-20 | 84 days supply | Qty: 84 | Fill #2

## 2016-05-18 DIAGNOSIS — Z01419 Encounter for gynecological examination (general) (routine) without abnormal findings: Secondary | ICD-10-CM | POA: Diagnosis not present

## 2016-05-18 DIAGNOSIS — Z3041 Encounter for surveillance of contraceptive pills: Secondary | ICD-10-CM | POA: Diagnosis not present

## 2016-05-18 DIAGNOSIS — Z124 Encounter for screening for malignant neoplasm of cervix: Secondary | ICD-10-CM | POA: Diagnosis not present

## 2016-05-18 DIAGNOSIS — N926 Irregular menstruation, unspecified: Secondary | ICD-10-CM | POA: Diagnosis not present

## 2016-05-18 MED FILL — XULANE PATCH: 150-35 | 84 days supply | Qty: 9 | Fill #0

## 2016-06-19 ENCOUNTER — Encounter (HOSPITAL_COMMUNITY): Payer: Self-pay | Admitting: Emergency Medicine

## 2016-06-19 ENCOUNTER — Emergency Department (HOSPITAL_COMMUNITY)
Admission: EM | Admit: 2016-06-19 | Discharge: 2016-06-19 | Disposition: A | Discharge status: Home / Self Care | Payer: 59 | Attending: Emergency Medicine | Admitting: Emergency Medicine

## 2016-06-19 DIAGNOSIS — H938X2 Other specified disorders of left ear: Secondary | ICD-10-CM | POA: Diagnosis present

## 2016-06-19 DIAGNOSIS — H6122 Impacted cerumen, left ear: Secondary | ICD-10-CM | POA: Insufficient documentation

## 2016-06-19 MED ORDER — DOCUSATE SODIUM 100 MG PO CAPS
100.0000 mg | ORAL_CAPSULE | Freq: Once | ORAL | Status: AC
  Administered 2016-06-19: 100 mg via ORAL
  Filled 2016-06-19: qty 1

## 2016-06-19 NOTE — ED Notes (Signed)
Successful ear wax removal, Pt able to hear out of left ear

## 2016-06-19 NOTE — Discharge Instructions (Signed)
You can call your doctor or call Dr. Annalee GentaShoemaker (Ear, Nose and Throat) for further management of ear wax obstruction. Recommend Cerumenex in the left ear to soften wax over time.

## 2016-06-19 NOTE — ED Triage Notes (Signed)
Pt states that she has had trouble hearing out of her L ear for approx 3 days. Pt denies pain, drainage,or bleeding from ear. And denies trauma or injury to ear.

## 2016-06-19 NOTE — ED Provider Notes (Signed)
[facility]-EMERGENCY DEPT Provider Note   CSN: [id number] Arrival date & time: 06/19/16  0440     History   Chief Complaint Chief Complaint  Patient presents with  . Hearing Problem    HPI Lori Perry is a 21 y.o. female.  Patient complains of discomfort and decreased, muffled hearing in left ear x 3 days. No fever, congestion, sore throat. She has had ear wax obstructions in the past that felt similar.    The history is provided by the patient. No language interpreter was used.    Past Medical History:  Diagnosis Date  . Normal labor 10/14/2014    Patient Active Problem List   Diagnosis Date Noted  . Vaginal delivery 10/15/2014  . 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 ADD (attention deficit disorder) 10/14/2014  . 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 Anemia 10/14/2014    History reviewed. No pertinent surgical history.  OB History    Gravida Para Term Preterm AB Living   1 1 1     1    SAB TAB Ectopic Multiple Live Births         0 1       Home Medications    Prior to Admission medications   Medication Sig Start Date End Date Taking? Authorizing Provider  ibuprofen (ADVIL,MOTRIN) 800 MG tablet Take 1 tablet (800 mg total) by mouth 3 (three) times daily. 05/07/15   Eber Hong, MD  levonorgestrel-ethinyl estradiol (ORSYTHIA) 0.1-20 MG-MCG tablet Take 1 tablet by mouth daily. 10/17/14   Nigel Bridgeman, CNM    Family History History reviewed. No pertinent family history.  Social History Social History  Substance Use Topics  . Smoking status: Never Smoker  . Smokeless tobacco: Never Used  . Alcohol use No     Allergies   Amoxicillin and Sulfa antibiotics   Review of Systems Review of Systems  Constitutional: Negative for fever.  HENT: Positive for ear pain and hearing loss. Negative for congestion and sore throat.   Respiratory: Negative for cough.   Gastrointestinal: Negative for nausea.  Musculoskeletal: Negative for myalgias.      Physical Exam Updated Vital Signs BP (!) 97/46   Pulse 82   Temp 98.1 F (36.7 C) (Oral)   Resp 22   Ht 5\' 5"  (1.651 m)   Wt 60.8 kg   SpO2 100%   BMI 22.30 kg/m   Physical Exam  Constitutional: She is oriented to person, place, and time. She appears well-developed and well-nourished.  HENT:  Left cerumen impaction.  Neck: Normal range of motion.  Pulmonary/Chest: Effort normal.  Neurological: She is alert and oriented to person, place, and time.  Skin: Skin is warm and dry.     ED Treatments / Results  Labs (all labs ordered are listed, but only abnormal results are displayed) Labs Reviewed - No data to display  EKG  EKG Interpretation None       Radiology No results found.  Procedures Procedures (including critical care time)  Medications Ordered in ED Medications  docusate sodium (COLACE) capsule 100 mg (100 mg Oral Given 06/19/16 0538)     Initial Impression / Assessment and Plan / ED Course  I have reviewed the triage vital signs and the nursing notes.  Pertinent labs & imaging results that were available during my care of the patient were reviewed by me and considered in my medical decision making (see chart for details).  Clinical Course     Lavage attempted for prolonged period  with use of Colace as well as dilute peroxide. Impaction remains and is not resolved. Will provide referral to ENT.  Final Clinical Impressions(s) / ED Diagnoses   Final diagnoses:  None  1. Cerumen impaction  New Prescriptions New Prescriptions   No medications on file     Elpidio AnisShari Kadejah Sandiford, Cordelia Poche-C 06/19/16 [id number]    Zadie Rhineonald Wickline, MD 06/19/16 [id number]

## 2016-07-25 ENCOUNTER — Ambulatory Visit: Payer: 59 | Admitting: Family Medicine

## 2016-08-28 MED FILL — XULANE PATCH: 150-35 | 28 days supply | Qty: 3 | Fill #1

## 2016-08-29 ENCOUNTER — Encounter: Payer: Self-pay | Admitting: Family Medicine

## 2016-08-29 ENCOUNTER — Ambulatory Visit (INDEPENDENT_AMBULATORY_CARE_PROVIDER_SITE_OTHER): Payer: 59 | Admitting: Family Medicine

## 2016-08-29 VITALS — BP 111/57 | HR 77 | Temp 97.8°F | Ht 66.75 in | Wt 129.0 lb

## 2016-08-29 DIAGNOSIS — H6123 Impacted cerumen, bilateral: Secondary | ICD-10-CM

## 2016-08-29 DIAGNOSIS — Z0000 Encounter for general adult medical examination without abnormal findings: Secondary | ICD-10-CM

## 2016-08-29 DIAGNOSIS — Z3045 Encounter for surveillance of transdermal patch hormonal contraceptive device: Secondary | ICD-10-CM | POA: Diagnosis not present

## 2016-08-29 NOTE — Progress Notes (Signed)
[facility] [address] [place], [place] [address] [phone] [phone]  Date:  08/29/2016   Name:  Lori BuryDanielle E Lantis   DOB:  03/07/1996   MRN:  [id number]  PCP:  Abbe AmsterdamOPLAND,Sallyanne Birkhead, MD    Chief Complaint: Establish Care   History of Present Illness:  Lori Perry is a 21 y.o. very pleasant female patient who presents with the following:  Here as a new patient today to establish care.  Available records reviewed in computer.  She has been seen in the system but is new to this office She had a baby about 2 years ago  She has a nearly 2 yo son at home- he is in good health Her only real health concern today is that she does tend up some wax in her ears at times and needs to have it removed. 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 Vaginal delivery 10/15/2014  . 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 ADD (attention deficit disorder) 10/14/2014  . 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 Anemia 10/14/2014    Past Medical History:  Diagnosis Date  . Anemia   . Normal labor 10/14/2014    No past surgical history on file.  Social History  Substance Use Topics  . Smoking status: Never Smoker  . Smokeless tobacco: Never Used  . Alcohol use No    Family History  Problem Relation Age of Onset  . Hypertension Mother     Allergies  Allergen Reactions  . Amoxicillin Rash  . Sulfa Antibiotics Rash    Medication list has been reviewed and  updated.  Current Outpatient Prescriptions on File Prior to Visit  Medication Sig Dispense Refill  . ibuprofen (ADVIL,MOTRIN) 800 MG tablet Take 1 tablet (800 mg total) by mouth 3 (three) times daily. 21 tablet 0  . norelgestromin-ethinyl estradiol [facility]ico(XULANE) 150-35 MCG/24HR transdermal patch Place 1 patch onto the skin once a week.    . [DISCONTINUED] ferrous sulfate 325 (65 FE) MG tablet Take 1 tablet (325 mg total) by mouth daily with breakfast. 30 tablet 1   No current facility-administered medications on file prior to visit.     Review of Systems: No fever, chills, CP, SOB, nausea, vomiting, diarrhea As per HPI- otherwise negative.   Physical Examination: Vitals:   08/29/16 1343  BP: (!) 111/57  Pulse: 77  Temp: 97.8 F (36.6 C)   Vitals:   08/29/16 1343  Weight: 129 lb (58.5 kg)  Height: 5' 6.75" (1.695 m)   Body mass index is 20.36 kg/m. Ideal Body Weight: Weight in (lb) to have BMI = 25: 158.1  GEN: WDWN, NAD, Non-toxic, A & O x 3, slim, looks well HEENT: Atraumatic, Normocephalic. Neck supple. No masses, No LAD.  Bilateral TM wnl, oropharynx normal.  PEERL,EOMI.   Some cerumen build up in right ear- removed with ear  loop Ears and Nose: No external deformity. CV: RRR, No M/G/R. No JVD. No thrill. No extra heart sounds. PULM: CTA B, no wheezes, crackles, rhonchi. No retractions. No resp. distress. No accessory muscle use. ABD: S, NT, ND EXTR: No c/c/e NEURO Normal gait.  PSYCH: Normally interactive. Conversant. Not depressed or anxious appearing.  Calm demeanor.    Assessment and Plan:  Excessive ear wax, bilateral  Encounter for medical examination to establish care  Encounter for surveillance of transdermal patch hormonal contraceptive device  Here to establish care Advised that we are happy to do her pap and contraception for her Removed some ear wax and discussed strategies to cut down on wax build up She will see me in December for a CPE    Signed Abbe Amsterdam, MD

## 2016-08-29 NOTE — Patient Instructions (Signed)
It was a pleasure to see you today- please see me in December for a physical and take care!  You might try using some ear wax removing drops at home to prevent wax build- up

## 2016-09-28 ENCOUNTER — Telehealth: Payer: Self-pay | Admitting: Family Medicine

## 2016-09-28 ENCOUNTER — Other Ambulatory Visit: Payer: Self-pay | Admitting: Emergency Medicine

## 2016-09-28 MED ORDER — NORELGESTROMIN-ETH ESTRADIOL 150-35 MCG/24HR TD PTWK: 1.0000 | MEDICATED_PATCH | TRANSDERMAL | 3 refills | Status: DC

## 2016-09-28 MED FILL — XULANE PATCH: 150-35 | 84 days supply | Qty: 9 | Fill #0

## 2016-09-28 NOTE — Telephone Encounter (Signed)
Pt stopped by requesting we call in her birth control to [facility]. Pt wants now if possible if not then Call pt [phone] to confirm when done.

## 2016-09-28 NOTE — Telephone Encounter (Signed)
Refill sent to pharmacy per pt request.

## 2016-12-08 IMAGING — DX DG TOE GREAT 2+V*L*
3 series · 3 of 3 positions shown · non-contrast
Comparison: None.

CLINICAL DATA: Left great toe pain, of unknown duration. Initial
encounter.

EXAM:
LEFT GREAT TOE

[toe ap]
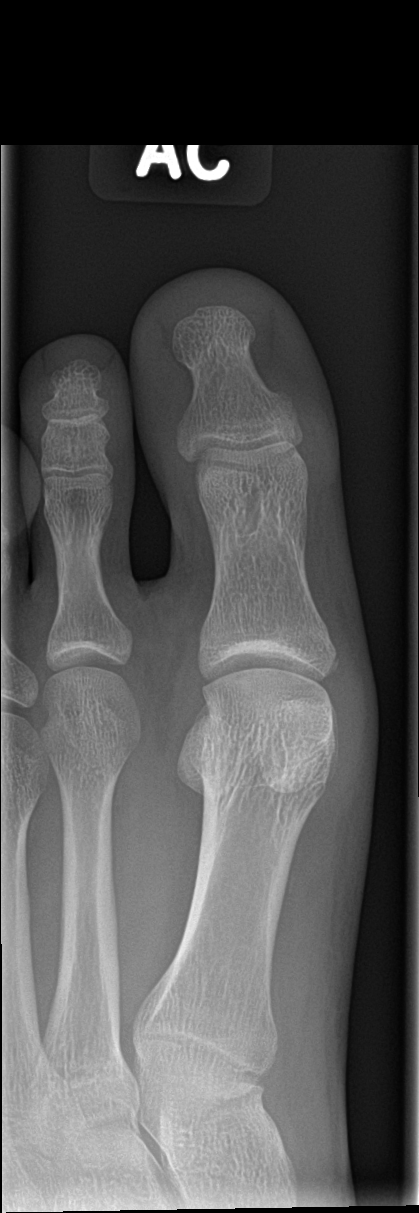

[toe obl]
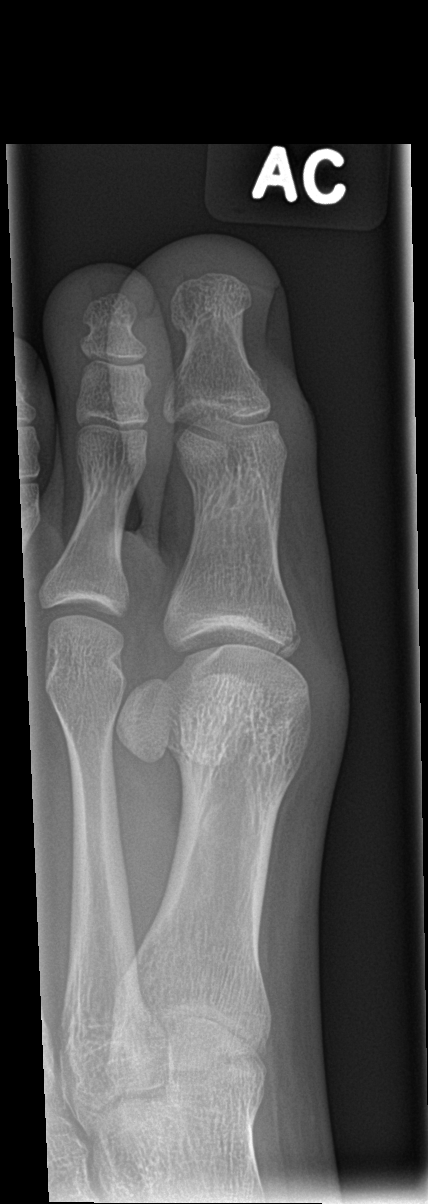

[toe lat]
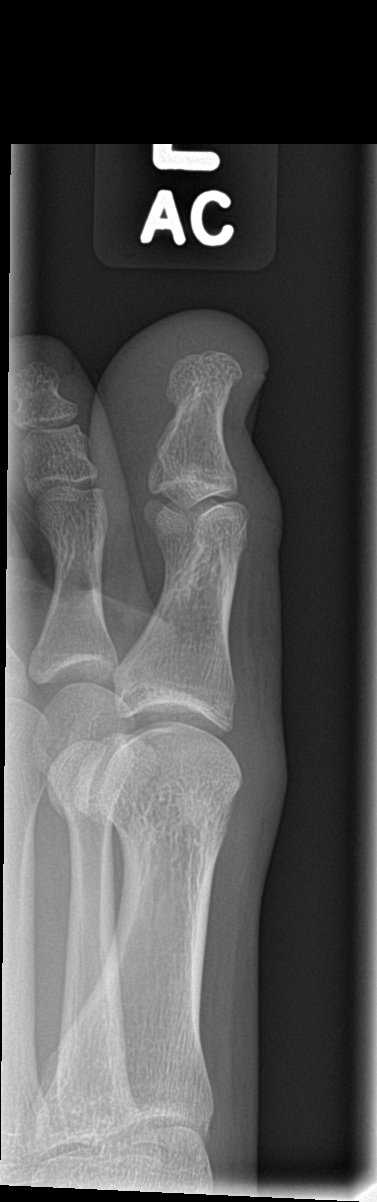

[3 of 3 positions shown; findings below may reference images not displayed]

FINDINGS: There appears to be a minimally displaced fracture at the medial
aspect of the base of the first proximal phalanx. Mild overlying
soft tissue swelling is suggested.

No additional fractures are seen. Remaining visualized joint spaces
are preserved.
IMPRESSION: Apparent minimally displaced fracture at the medial aspect of the
base of the first proximal phalanx.

## 2016-12-24 MED FILL — XULANE PATCH: 150-35 | 28 days supply | Qty: 3 | Fill #1

## 2017-01-21 ENCOUNTER — Other Ambulatory Visit: Payer: Self-pay

## 2017-01-21 ENCOUNTER — Telehealth: Payer: Self-pay | Admitting: Family Medicine

## 2017-01-21 MED ORDER — NORELGESTROMIN-ETH ESTRADIOL 150-35 MCG/24HR TD PTWK: 1.0000 | MEDICATED_PATCH | TRANSDERMAL | 3 refills | Status: DC

## 2017-01-21 MED FILL — XULANE PATCH: 150-35 | 28 days supply | Qty: 3 | Fill #0

## 2017-01-21 NOTE — Telephone Encounter (Signed)
Self.  Refill request for norelgestromin-ethinyl estradiol   Pharmacy: [facility] - [place], [place] - [address]

## 2017-01-29 ENCOUNTER — Other Ambulatory Visit: Payer: Self-pay

## 2017-01-29 MED ORDER — NORELGESTROMIN-ETH ESTRADIOL 150-35 MCG/24HR TD PTWK: 1.0000 | MEDICATED_PATCH | TRANSDERMAL | 3 refills | Status: DC

## 2017-01-29 NOTE — Telephone Encounter (Signed)
Routed refill request to Dr. Patsy Lager.

## 2017-02-20 MED FILL — XULANE PATCH: 150-35 | 28 days supply | Qty: 3 | Fill #1

## 2017-03-19 MED FILL — XULANE PATCH: 150-35 | 28 days supply | Qty: 3 | Fill #2

## 2017-04-17 MED FILL — XULANE PATCH: 150-35 | 28 days supply | Qty: 3 | Fill #3

## 2017-05-14 ENCOUNTER — Telehealth: Payer: Self-pay | Admitting: Family Medicine

## 2017-05-14 NOTE — Telephone Encounter (Signed)
Copied from CRM [id number]. Topic: Quick Communication - See Telephone Encounter >> May 14, 2017  8:56 AM Elliot GaultBell, Tiffany M wrote: Relation to pt: self  Call back number: [phone] Pharmacy: [facility] [place], [place] - [address] [phone] (Phone) [phone] (Fax)    Reason for call:  Patient requesting norelgestromin-ethinyl estradiol Burr Medico(XULANE) 150-35 MCG/24HR transdermal patch, patient did not contact pharmacy, informed patient please call pharmacy in the future and please allow PCP 72 hour turn around. (patient aware practice is closed today) patient has a scheduled physical for 05/23/17 and states she's completely out, please advise

## 2017-05-15 NOTE — Telephone Encounter (Signed)
Called and LMOM- I think she has refills at the pharmacy,. Please call them to request a refill

## 2017-05-15 NOTE — Telephone Encounter (Signed)
FYI. Please advise.

## 2017-05-15 NOTE — Telephone Encounter (Signed)
[facility]   [facility]   [facility]

## 2017-05-16 MED FILL — XULANE PATCH: 150-35 | 84 days supply | Qty: 9 | Fill #0

## 2017-05-22 NOTE — Progress Notes (Addendum)
[facility] [address] [place], [place] [address] [phone] [phone]  Date:  05/23/2017   Name:  Lori BuryDanielle E Poulton   DOB:  12/31/1995   MRN:  [id number]  PCP:  Pearline Cablesopland, Jessica C, MD    Chief Complaint: No chief complaint on file.   History of Present Illness:  Lori Perry is a 21 y.o. very pleasant female patient who presents with the following:  CPE today From our last visit in March: Here as a new patient today to establish care.  Available records reviewed in computer.  She has been seen in the system but is new to this office She had a baby about 2 years ago She has a nearly 2 yo son at home- he is in good health Her only real health concern today is that she does tend up some wax in her ears at times and needs to have it removed. 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 Vaginal delivery 10/15/2014  . 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 ADD (attention deficit disorder) 10/14/2014  .  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 Anemia 10/14/2014    Past Medical History:  Diagnosis Date  . Anemia   . Normal labor 10/14/2014    History reviewed. No pertinent surgical history.  Social History   Tobacco Use  . Smoking status: Never Smoker  . Smokeless tobacco: Never Used  Substance Use Topics  . Alcohol use: No    Alcohol/week: 0.0 oz  . Drug use: No    Family History  Problem Relation Age of Onset  . Hypertension Mother     Allergies  Allergen Reactions  . Amoxicillin Rash  . Sulfa Antibiotics Rash    Medication list has been reviewed and updated.  Current Outpatient Medications on File Prior to Visit  Medication Sig Dispense Refill  . ibuprofen (ADVIL,MOTRIN) 800 MG tablet Take 1 tablet (800 mg total) by mouth 3 (three) times daily. 21 tablet 0  . norelgestromin-ethinyl estradiol Bur[facility] Medico(XULANE) 150-35 MCG/24HR transdermal patch Place 1 patch onto the skin once a week. 12 patch 3  . [DISCONTINUED] ferrous sulfate 325 (65 FE) MG tablet Take 1 tablet (325 mg total) by mouth daily with breakfast. 30 tablet 1   No current facility-administered medications on file prior to visit.  Review of Systems:  As per HPI- otherwise negative.   Physical Examination: Vitals:   05/23/17 1410  BP: 123/72  Pulse: 100  Resp: 18  Temp: (!) 97.2 F (36.2 C)  SpO2: 100%   Vitals:   05/23/17 1410  Weight: 132 lb 6.4 oz (60.1 kg)  Height: 5\' 6"  (1.676 m)   Body mass index is 21.37 kg/m. Ideal Body Weight: Weight in (lb) to have BMI = 25: 154.6  GEN: WDWN, NAD, Non-toxic, A & O x 3, looks well, normal weight HEENT: Atraumatic, Normocephalic. Neck supple. No masses, No LAD. Ears and Nose: No external deformity. CV: RRR, No M/G/R. No JVD. No thrill. No extra heart sounds. PULM: CTA B, no wheezes, crackles, rhonchi. No retractions. No resp. distress. No accessory muscle use. ABD: S, NT, ND EXTR: No c/c/e NEURO Normal gait.  PSYCH: Normally  interactive. Conversant. Not depressed or anxious appearing.  Calm demeanor.  impacted cerumen both ears- irrigated and resolved.  Normal TM after irriagation  Pulse Readings from Last 3 Encounters:  05/23/17 100  08/29/16 77  06/19/16 71    Assessment and Plan: Physical exam  Screening for deficiency anemia - Plan: CBC  Screening for diabetes mellitus - Plan: Comprehensive metabolic panel, Hemoglobin A1c  Screening for hyperlipidemia - Plan: Lipid panel  CPE today- she has no concerns Declined flu shot today  Removed cerumen from ears Labs pending as above- Will plan further follow- up pending labs.   Signed Abbe AmsterdamJessica Copland, MD 12/21- Received her labs  Your labs look great overall.  Cholesterol is very good My only concern is that your potassium is a bit low.  I would like to repeat this in a week or so (lab visit only) to make sure it comes back to normal.  In the meantime, please increase your dietary intake of potassium.  1-2 bananas a day should be sufficient.  Results for orders placed or performed in visit on 05/23/17  CBC  Result Value Ref Range   WBC 6.2 4.0 - 10.5 K/uL   RBC 4.25 3.87 - 5.11 Mil/uL   Platelets 204.0 150.0 - 400.0 K/uL   Hemoglobin 12.5 12.0 - 15.0 g/dL   HCT 16.138.5 09.636.0 - 04.546.0 %   MCV 90.6 78.0 - 100.0 fl   MCHC 32.3 30.0 - 36.0 g/dL   RDW 40.913.7 81.111.5 - 91.415.5 %  Comprehensive metabolic panel  Result Value Ref Range   Sodium 141 135 - 145 mEq/L   Potassium 3.2 (L) 3.5 - 5.1 mEq/L   Chloride 106 96 - 112 mEq/L   CO2 27 19 - 32 mEq/L   Glucose, Bld 94 70 - 99 mg/dL   BUN 5 (L) 6 - 23 mg/dL   Creatinine, Ser 7.820.87 0.40 - 1.20 mg/dL   Total Bilirubin 0.3 0.2 - 1.2 mg/dL   Alkaline Phosphatase 52 39 - 117 U/L   AST 13 0 - 37 U/L   ALT 9 0 - 35 U/L   Total Protein 7.2 6.0 - 8.3 g/dL   Albumin 4.1 3.5 - 5.2 g/dL   Calcium 8.8 8.4 - 95.610.5 mg/dL   GFR 21.3085.79 >86.57>60.00 mL/min  Hemoglobin A1c  Result Value Ref Range   Hgb A1c MFr Bld 5.4 4.6 - 6.5 %   Lipid panel  Result Value Ref Range   Cholesterol 121 0 - 200 mg/dL   Triglycerides 84.656.0 0.0 - 149.0 mg/dL   HDL 96.2948.20 >52.84>39.00 mg/dL   VLDL 13.211.2 0.0 - 44.040.0 mg/dL  LDL Cholesterol 62 0 - 99 mg/dL   Total CHOL/HDL Ratio 3    NonHDL 73.27

## 2017-05-23 ENCOUNTER — Ambulatory Visit (INDEPENDENT_AMBULATORY_CARE_PROVIDER_SITE_OTHER): Payer: 59 | Admitting: Family Medicine

## 2017-05-23 ENCOUNTER — Encounter: Payer: Self-pay | Admitting: Family Medicine

## 2017-05-23 VITALS — BP 123/72 | HR 100 | Temp 97.2°F | Resp 18 | Ht 66.0 in | Wt 132.4 lb

## 2017-05-23 DIAGNOSIS — Z0000 Encounter for general adult medical examination without abnormal findings: Secondary | ICD-10-CM | POA: Diagnosis not present

## 2017-05-23 DIAGNOSIS — Z131 Encounter for screening for diabetes mellitus: Secondary | ICD-10-CM

## 2017-05-23 DIAGNOSIS — E876 Hypokalemia: Secondary | ICD-10-CM | POA: Diagnosis not present

## 2017-05-23 DIAGNOSIS — Z130 Encounter for screening for diseases of the blood and blood-forming organs and certain disorders involving the immune mechanism: Secondary | ICD-10-CM

## 2017-05-23 DIAGNOSIS — Z13220 Encounter for screening for lipoid disorders: Secondary | ICD-10-CM

## 2017-05-23 NOTE — Patient Instructions (Signed)
Good to see you today!  Take care and let me know if you need anything  I will be in touch with your labs asap

## 2017-05-24 ENCOUNTER — Encounter: Payer: Self-pay | Admitting: Family Medicine

## 2017-05-24 LAB — LIPID PANEL
CHOL/HDL RATIO: 3
Cholesterol: 121 mg/dL (ref 0–200)
HDL: 48.2 mg/dL (ref >39.00)
LDL Cholesterol: 62 mg/dL (ref 0–99)
NONHDL: 73.27
Triglycerides: 56 mg/dL (ref 0.0–149.0)
VLDL: 11.2 mg/dL (ref 0.0–40.0)

## 2017-05-24 LAB — CBC
HCT: 38.5 % (ref 36.0–46.0)
Hemoglobin: 12.5 g/dL (ref 12.0–15.0)
MCHC: 32.3 g/dL (ref 30.0–36.0)
MCV: 90.6 fl (ref 78.0–100.0)
Platelets: 204 10*3/uL (ref 150.0–400.0)
RBC: 4.25 Mil/uL (ref 3.87–5.11)
RDW: 13.7 % (ref 11.5–15.5)
WBC: 6.2 10*3/uL (ref 4.0–10.5)

## 2017-05-24 LAB — COMPREHENSIVE METABOLIC PANEL
ALT: 9 U/L (ref 0–35)
AST: 13 U/L (ref 0–37)
Albumin: 4.1 g/dL (ref 3.5–5.2)
Alkaline Phosphatase: 52 U/L (ref 39–117)
BUN: 5 mg/dL — AB (ref 6–23)
CHLORIDE: 106 meq/L (ref 96–112)
CO2: 27 mEq/L (ref 19–32)
CREATININE: 0.87 mg/dL (ref 0.40–1.20)
Calcium: 8.8 mg/dL (ref 8.4–10.5)
GFR: 85.79 mL/min (ref >60.00)
GLUCOSE: 94 mg/dL (ref 70–99)
Potassium: 3.2 mEq/L — ABNORMAL LOW (ref 3.5–5.1)
SODIUM: 141 meq/L (ref 135–145)
TOTAL PROTEIN: 7.2 g/dL (ref 6.0–8.3)
Total Bilirubin: 0.3 mg/dL (ref 0.2–1.2)

## 2017-05-24 LAB — HEMOGLOBIN A1C: HEMOGLOBIN A1C: 5.4 % (ref 4.6–6.5)

## 2017-05-24 NOTE — Addendum Note (Signed)
Addended by: Abbe AmsterdamOPLAND, Anniyah Mood C on: 05/24/2017 02:50 PM   Modules accepted: Orders

## 2017-05-25 ENCOUNTER — Encounter (HOSPITAL_COMMUNITY): Payer: Self-pay

## 2017-05-25 ENCOUNTER — Inpatient Hospital Stay (HOSPITAL_COMMUNITY): Admission: AD | Admit: 2017-05-25 | Payer: 59 | Source: Intra-hospital | Admitting: Psychiatry

## 2017-05-25 ENCOUNTER — Emergency Department (HOSPITAL_COMMUNITY)
Admission: EM | Admit: 2017-05-25 | Discharge: 2017-05-25 | Disposition: A | Discharge status: Home / Self Care | Payer: 59 | Attending: Emergency Medicine | Admitting: Emergency Medicine

## 2017-05-25 ENCOUNTER — Other Ambulatory Visit: Payer: Self-pay

## 2017-05-25 DIAGNOSIS — T50992A Poisoning by other drugs, medicaments and biological substances, intentional self-harm, initial encounter: Secondary | ICD-10-CM | POA: Diagnosis not present

## 2017-05-25 DIAGNOSIS — F99 Mental disorder, not otherwise specified: Secondary | ICD-10-CM | POA: Diagnosis present

## 2017-05-25 DIAGNOSIS — Z79899 Other long term (current) drug therapy: Secondary | ICD-10-CM | POA: Insufficient documentation

## 2017-05-25 DIAGNOSIS — F909 Attention-deficit hyperactivity disorder, unspecified type: Secondary | ICD-10-CM | POA: Insufficient documentation

## 2017-05-25 DIAGNOSIS — T1491XA Suicide attempt, initial encounter: Secondary | ICD-10-CM | POA: Diagnosis not present

## 2017-05-25 DIAGNOSIS — IMO0001 Reserved for inherently not codable concepts without codable children: Secondary | ICD-10-CM

## 2017-05-25 LAB — CBC
HCT: 39.4 % (ref 36.0–46.0)
HEMOGLOBIN: 13.2 g/dL (ref 12.0–15.0)
MCH: 28.9 pg (ref 26.0–34.0)
MCHC: 33.5 g/dL (ref 30.0–36.0)
MCV: 86.4 fL (ref 78.0–100.0)
Platelets: 247 10*3/uL (ref 150–400)
RBC: 4.56 MIL/uL (ref 3.87–5.11)
RDW: 12.7 % (ref 11.5–15.5)
WBC: 5.5 10*3/uL (ref 4.0–10.5)

## 2017-05-25 LAB — COMPREHENSIVE METABOLIC PANEL
ALBUMIN: 4 g/dL (ref 3.5–5.0)
ALT: 27 U/L (ref 14–54)
AST: 35 U/L (ref 15–41)
Alkaline Phosphatase: 62 U/L (ref 38–126)
Anion gap: 8 (ref 5–15)
BUN: 6 mg/dL (ref 6–20)
CHLORIDE: 109 mmol/L (ref 101–111)
CO2: 22 mmol/L (ref 22–32)
CREATININE: 0.75 mg/dL (ref 0.44–1.00)
Calcium: 8.9 mg/dL (ref 8.9–10.3)
GFR calc Af Amer: >60 mL/min (ref >60)
GFR calc non Af Amer: >60 mL/min (ref >60)
Glucose, Bld: 102 mg/dL — ABNORMAL HIGH (ref 65–99)
Potassium: 3.2 mmol/L — ABNORMAL LOW (ref 3.5–5.1)
SODIUM: 139 mmol/L (ref 135–145)
Total Bilirubin: 0.4 mg/dL (ref 0.3–1.2)
Total Protein: 7.7 g/dL (ref 6.5–8.1)

## 2017-05-25 LAB — RAPID URINE DRUG SCREEN, HOSP PERFORMED
AMPHETAMINES: NOT DETECTED
BENZODIAZEPINES: NOT DETECTED
Barbiturates: NOT DETECTED
Cocaine: NOT DETECTED
OPIATES: NOT DETECTED
TETRAHYDROCANNABINOL: NOT DETECTED

## 2017-05-25 LAB — SALICYLATE LEVEL
Salicylate Lvl: <7 mg/dL (ref 2.8–30.0)
Salicylate Lvl: <7 mg/dL (ref 2.8–30.0)

## 2017-05-25 LAB — ETHANOL: Alcohol, Ethyl (B): <10 mg/dL (ref <10)

## 2017-05-25 LAB — I-STAT BETA HCG BLOOD, ED (MC, WL, AP ONLY)

## 2017-05-25 LAB — ACETAMINOPHEN LEVEL
Acetaminophen (Tylenol), Serum: 88 ug/mL — ABNORMAL HIGH (ref 10–30)
Acetaminophen (Tylenol), Serum: 56 ug/mL — ABNORMAL HIGH (ref 10–30)

## 2017-05-25 NOTE — ED Notes (Signed)
TTS at bedside. 

## 2017-05-25 NOTE — Discharge Instructions (Signed)
Follow up with behavioral health. Call help line and or return to ER for persistent thoughts or plan of self harm.

## 2017-05-25 NOTE — ED Triage Notes (Signed)
Pt states she took a half a bottle of dayquill today in attempt to harm herself. Pt states she has been having a lot of stress lately. States no SI in the past. Pt reports she had one episode of vomiting. Skin warm and dry, vitals stable.

## 2017-05-25 NOTE — ED Notes (Signed)
Call received from TTS stating that Pt meets inpatient criteria [facility]

## 2017-05-25 NOTE — BH Assessment (Signed)
Tele Assessment Note   Patient Name: Lori BuryDanielle E Perry MRN: [id number] Referring Physician: Dr. Jodi MourningZavitz Location of Patient: [facility] Location of Provider: Behavioral Health TTS Department  Lori Perry is an 21 y.o. female. Pt admits to a suicide attempt. The Pt states she drank a half a bottle of Dayquil. The Pt states that her husband asked to separate and "I couldn't handle it." The Pt denies previous SI attempts. The Pt denies previous hospitalizations. The Pt denies current outpatient treatment and mental health medication. The denies SA and abuse. Pt denies HI and AVH.  Lori Fermoina, NP recommends inpatient treatment. TTS will seek placement.  Diagnosis:  F33.2 MDD  Past Medical History:  Past Medical History:  Diagnosis Date  . Anemia   . Normal labor 10/14/2014    History reviewed. No pertinent surgical history.  Family History:  Family History  Problem Relation Age of Onset  . Hypertension Mother     Social History:  reports that  has never smoked. she has never used smokeless tobacco. She reports that she does not drink alcohol or use drugs.  Additional Social History:  Alcohol / Drug Use Pain Medications: please see mar Prescriptions: please see mar Over the Counter: please see mar History of alcohol / drug use?: No history of alcohol / drug abuse Longest period of sobriety (when/how long): NA  CIWA: CIWA-Ar BP: (!) 107/58 Pulse Rate: 85 COWS:    PATIENT STRENGTHS: (choose at least two) Average or above average intelligence Capable of independent living  Allergies:  Allergies  Allergen Reactions  . Amoxicillin Rash  . Sulfa Antibiotics Rash    Home Medications:  (Not in a hospital admission)  OB/GYN Status:  Patient's last menstrual period was 05/10/2017 (within days).  General Assessment Data [facility] Assessment: In system Is this a Tele or Face-to-Face Assessment?: Tele Assessment Is this an Initial Assessment or a  Re-assessment for this encounter?: Initial Assessment Marital status: Married Cross TimberMaiden name: Anner CreteWells Is patient pregnant?: No Pregnancy Status: No Living Arrangements: Spouse/significant other Can pt return to current living arrangement?: Yes Admission Status: Voluntary Is patient capable of signing voluntary admission?: Yes Referral Source: Self/Family/Friend Insurance type: UMR     Crisis Care Plan Living Arrangements: Spouse/significant other Legal Guardian: Other:(self) Name of Psychiatrist: NA Name of Therapist: NA  Education Status Is patient currently in school?: No Current Grade: NA Highest grade of school patient has completed: 12 Name of school: NA Contact person: nA  Risk to self with the past 6 months Suicidal Ideation: Yes-Currently Present Has patient been a risk to self within the past 6 months prior to admission? : No Suicidal Intent: Yes-Currently Present Has patient had any suicidal intent within the past 6 months prior to admission? : No Is patient at risk for suicide?: Yes Suicidal Plan?: Yes-Currently Present Has patient had any suicidal plan within the past 6 months prior to admission? : No Specify Current Suicidal Plan: Pt overdosed Access to Means: No What has been your use of drugs/alcohol within the last 12 months?: NA Previous Attempts/Gestures: No How many times?: 0 Other Self Harm Risks: NA Triggers for Past Attempts: Family contact Intentional Self Injurious Behavior: None Family Suicide History: No Recent stressful life event(s): Conflict (Comment) Persecutory voices/beliefs?: No Depression: Yes Depression Symptoms: Insomnia, Tearfulness, Isolating, Loss of interest in usual pleasures, Feeling worthless/self pity, Feeling angry/irritable Substance abuse history and/or treatment for substance abuse?: No Suicide prevention information given to non-admitted patients: Not applicable  Risk  to Others within the past 6 months Homicidal Ideation:  No Does patient have any lifetime risk of violence toward others beyond the six months prior to admission? : No Thoughts of Harm to Others: No Current Homicidal Intent: No Current Homicidal Plan: No Access to Homicidal Means: No Identified Victim: NA History of harm to others?: No Assessment of Violence: None Noted Violent Behavior Description: NA Does patient have access to weapons?: No Criminal Charges Pending?: No Does patient have a court date: No Is patient on probation?: No  Psychosis Hallucinations: None noted Delusions: None noted  Mental Status Report Appearance/Hygiene: Unremarkable Eye Contact: Fair Motor Activity: Freedom of movement Speech: Logical/coherent Level of Consciousness: Alert Mood: Depressed Affect: Depressed Anxiety Level: None Thought Processes: Coherent, Relevant Judgement: Impaired Orientation: Person, Place, Time, Situation Obsessive Compulsive Thoughts/Behaviors: None  Cognitive Functioning Concentration: Normal Memory: Recent Intact, Remote Intact IQ: Average Insight: Poor Impulse Control: Poor Appetite: Fair Weight Loss: 0 Weight Gain: 0 Sleep: Decreased Total Hours of Sleep: 5 Vegetative Symptoms: None  ADLScreening [facility]) Patient's cognitive ability adequate to safely complete daily activities?: Yes Patient able to express need for assistance with ADLs?: Yes Independently performs ADLs?: Yes (appropriate for developmental age)  Prior Inpatient Therapy Prior Inpatient Therapy: No Prior Therapy Dates: NA Prior Therapy Facilty/Provider(s): NA Reason for Treatment: NA  Prior Outpatient Therapy Prior Outpatient Therapy: No Prior Therapy Dates: NA Prior Therapy Facilty/Provider(s): Na Reason for Treatment: NA Does patient have an ACCT team?: No Does patient have Intensive In-House Services?  : No Does patient have [facility] services? : No Does patient have P4CC services?: No  ADL Screening (condition at  time of admission) Patient's cognitive ability adequate to safely complete daily activities?: Yes Is the patient deaf or have difficulty hearing?: No Does the patient have difficulty concentrating, remembering, or making decisions?: No Patient able to express need for assistance with ADLs?: Yes Does the patient have difficulty dressing or bathing?: No Independently performs ADLs?: Yes (appropriate for developmental age) Does the patient have difficulty walking or climbing stairs?: No Weakness of Legs: None Weakness of Arms/Hands: None             Advance Directives (For Healthcare) Does Patient Have a Medical Advance Directive?: No Would patient like information on creating a medical advance directive?: No - Patient declined    Additional Information 1:1 In Past 12 Months?: No CIRT Risk: No Elopement Risk: No Does patient have medical clearance?: No     Disposition:  Disposition Initial Assessment Completed for this Encounter: Yes Disposition of Patient: Inpatient treatment program Type of inpatient treatment program: Adult  This service was provided via telemedicine using a 2-way, interactive audio and video technology.  Names of all persons participating in this telemedicine service and their role in this encounter. Name: Cherre Robinsina O. Role: NP  Name:  Role:   Name:  Role:   Name:  Role:     Emmit PomfretLevette,Virdell Hoiland D 05/25/2017 5:59 PM

## 2017-05-25 NOTE — ED Notes (Signed)
Spoke with poison control and updated them on pt status, repeat lab values, and current vitals. Poison control to close out case at this time.

## 2017-05-25 NOTE — ED Provider Notes (Signed)
Patient care signed out to follow-up behavior health recommendations and reassessed the patient. Patient is not actively suicidal without a plan. Patient has good family support and a place to stay.  Behavioral Health feels she does meet criteria for inpatient however patient prefers outpatient therapy. Behavioral Health sent over outpatient follow-up and intense treatment options. Patient signed a Engineer, manufacturing systemssafety contract. Family in agreement as well. Patient has capacity to make decisions.  Kenton KingfisherJoshua M Kana Reimann    Samvel Zinn, MD 05/25/17 [phone]

## 2017-05-25 NOTE — ED Notes (Addendum)
Spoke with pt and pt family who would prefer outpatient treatment. EDP made aware and agrees. [facility] called and informed of pt and family decision. Outpatient resources provided to pt for follow up.

## 2017-05-25 NOTE — ED Provider Notes (Signed)
[facility] Note   CSN: [id number] Arrival date & time: 05/25/17  1223     History   Chief Complaint Chief Complaint  Patient presents with  . Suicidal    HPI Lori BuryDanielle E Delsol is a 21 y.o. female.  The history is provided by the patient. No language interpreter was used.    Lori Perry is a 21 y.o. female who presents to the Emergency Department complaining of suicidal.  She presents to the emergency department voluntarily following suicide attempt.  She states that over the last few days her husband has talked about separating and she is felt increased stress.  She also reports having a cold lately.  When she got home from work this morning she drank half a bottle of DayQuil so she would not have to deal with things anymore.  When asked if this was a suicide attempt she states she is not sure.  No history of suicidal thoughts or attempts in the past.  She did vomit on ED arrival.  She denies any nausea or abdominal pain at this time.  Past Medical History:  Diagnosis Date  . Anemia   . Normal labor 10/14/2014    Patient Active Problem List   Diagnosis Date Noted  . Vaginal delivery 10/15/2014  . 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 ADD (attention deficit disorder) 10/14/2014  . 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 Anemia 10/14/2014    History reviewed. No pertinent surgical history.  OB History    Gravida Para Term Preterm AB Living   1 1 1     1    SAB TAB Ectopic Multiple Live Births         0 1       Home Medications    Prior to Admission medications   Medication Sig Start Date End Date Taking? Authorizing Provider  norelgestromin-ethinyl estradiol Burr Medico(XULANE) 150-35 MCG/24HR transdermal patch Place 1 patch onto the skin once a week. 01/29/17  Yes Copland, Gwenlyn FoundJessica C, MD  ibuprofen (ADVIL,MOTRIN) 800 MG tablet Take 1 tablet (800 mg total) by mouth 3 (three) times daily. Patient not taking:  Reported on 05/25/2017 05/07/15   Eber HongMiller, Brian, MD    Family History Family History  Problem Relation Age of Onset  . Hypertension Mother     Social History Social History   Tobacco Use  . Smoking status: Never Smoker  . Smokeless tobacco: Never Used  Substance Use Topics  . Alcohol use: No    Alcohol/week: 0.0 oz  . Drug use: No     Allergies   Amoxicillin and Sulfa antibiotics   Review of Systems Review of Systems   Physical Exam Updated Vital Signs BP (!) 107/58   Pulse 85   Temp 98.1 F (36.7 C) (Oral)   Resp 12   LMP 05/10/2017 (Within Days)   SpO2 100%   Physical Exam  Constitutional: She is oriented to person, place, and time. She appears well-developed and well-nourished.  HENT:  Head: Normocephalic and atraumatic.  Cardiovascular: Normal rate and regular rhythm.  No murmur heard. Pulmonary/Chest: Effort normal and breath sounds normal. No respiratory distress.  Abdominal: Soft. There is no tenderness. There is no rebound and no guarding.  Musculoskeletal: She exhibits no edema or tenderness.  Neurological: She is alert and oriented to person, place, and time.  Skin: Skin is warm and dry.  Psychiatric:  Flat affect  Nursing note and vitals reviewed.    ED Treatments /  Results  Labs (all labs ordered are listed, but only abnormal results are displayed) Labs Reviewed  COMPREHENSIVE METABOLIC PANEL - Abnormal; Notable for the following components:      Result Value   Potassium 3.2 (*)    Glucose, Bld 102 (*)    All other components within normal limits  ACETAMINOPHEN LEVEL - Abnormal; Notable for the following components:   Acetaminophen (Tylenol), Serum 88 (*)    All other components within normal limits  ETHANOL  SALICYLATE LEVEL  CBC  RAPID URINE DRUG SCREEN, HOSP PERFORMED  ACETAMINOPHEN LEVEL  SALICYLATE LEVEL  I-STAT BETA HCG BLOOD, ED ([facility], [facility], [facility] ONLY)  CBG MONITORING, ED    EKG  EKG Interpretation None        Radiology No results found.  Procedures Procedures (including critical care time)  Medications Ordered in ED Medications - No data to display   Initial Impression / Assessment and Plan / ED Course  I have reviewed the triage vital signs and the nursing notes.  Pertinent labs & imaging results that were available during my care of the patient were reviewed by me and considered in my medical decision making (see chart for details).     Patient here following suicide attempt by ingesting DayQuil.  She did have vomiting on ED arrival and currently symptoms have resolved.  Initial acetaminophen level is elevated, need to repeat acetaminophen level to assess for need for treatment.  Patient care transferred pending repeat acetaminophen level and behavioral health consult.  Final Clinical Impressions(s) / ED Diagnoses   Final diagnoses:  None    ED Discharge Orders    None       Tilden Fossaees, Hawk Mones, MD 05/25/17 [phone]

## 2017-05-25 NOTE — ED Notes (Signed)
Spoke to poison control could be concerned for possible acetominiphen toxicity. Pt will need an EKG, continuous monitoring.   Recheck tylenol level at 3pm if  14750mcg/ml or greater then start acetylcysteine. If less than 12650mcg/ml   Gina from poison control.

## 2017-07-25 MED FILL — XULANE PATCH: 150-35 | 84 days supply | Qty: 9 | Fill #1

## 2017-10-31 MED FILL — XULANE PATCH: 150-35 | 84 days supply | Qty: 9 | Fill #2

## 2018-01-29 MED FILL — XULANE PATCH: 150-35 | 84 days supply | Qty: 9 | Fill #3

## 2018-04-22 ENCOUNTER — Other Ambulatory Visit: Payer: Self-pay | Admitting: Family Medicine

## 2018-04-22 MED FILL — XULANE PATCH: 150-35 | 28 days supply | Qty: 3 | Fill #0

## 2018-05-08 ENCOUNTER — Ambulatory Visit (HOSPITAL_COMMUNITY)
Admission: EM | Admit: 2018-05-08 | Discharge: 2018-05-08 | Disposition: A | Discharge status: Home / Self Care | Payer: 59 | Attending: Family Medicine | Admitting: Family Medicine

## 2018-05-08 ENCOUNTER — Telehealth: Payer: Self-pay

## 2018-05-08 ENCOUNTER — Ambulatory Visit: Payer: Self-pay | Admitting: Family Medicine

## 2018-05-08 ENCOUNTER — Other Ambulatory Visit: Payer: Self-pay

## 2018-05-08 ENCOUNTER — Encounter (HOSPITAL_COMMUNITY): Payer: Self-pay | Admitting: Emergency Medicine

## 2018-05-08 DIAGNOSIS — B9789 Other viral agents as the cause of diseases classified elsewhere: Secondary | ICD-10-CM | POA: Diagnosis not present

## 2018-05-08 DIAGNOSIS — J069 Acute upper respiratory infection, unspecified: Secondary | ICD-10-CM | POA: Diagnosis not present

## 2018-05-08 MED ORDER — CETIRIZINE HCL 10 MG PO CAPS: 10.0000 mg | ORAL_CAPSULE | Freq: Every day | ORAL | 0 refills | Status: DC

## 2018-05-08 MED ORDER — GUAIFENESIN-DM 100-10 MG/5ML PO SYRP: 5.0000 mL | ORAL_SOLUTION | ORAL | 0 refills | Status: DC | PRN

## 2018-05-08 MED ORDER — IBUPROFEN 600 MG PO TABS: 600.0000 mg | ORAL_TABLET | Freq: Four times a day (QID) | ORAL | 0 refills | Status: DC | PRN

## 2018-05-08 MED FILL — IBUPROFEN 600 MG TABLET: 600 | 7 days supply | Qty: 30 | Fill #0

## 2018-05-08 MED FILL — SM TUSSIN DM SYRUP: 100-10 | 4 days supply | Qty: 118 | Fill #0

## 2018-05-08 MED FILL — CETIRIZINE HCL 10 MG TABS: 10 | 100 days supply | Qty: 100 | Fill #0

## 2018-05-08 NOTE — Telephone Encounter (Signed)
She could be put somewhere in my 5 pm hour but we will have to get a Production designer, theatre/television/filmmanager to [employer]

## 2018-05-08 NOTE — Telephone Encounter (Signed)
Copied from CRM [id number]. Topic: General - Other >> May 08, 2018  7:08 AM Gerrianne ScalePayne, Angela L wrote: Reason for CRM: pt Mother Boyd Kerbsenny calling wanting to know if Dr Patsy Lageropland could get her in today due to possible URI and head congestion

## 2018-05-08 NOTE — Discharge Instructions (Addendum)
You likely having a viral upper respiratory infection. We recommended symptom control. I expect your symptoms to start improving in the next 1-2 weeks.  ° °1. Take a daily allergy pill/anti-histamine like Zyrtec, Claritin, or Store brand consistently for 2 weeks ° °2. For congestion you may try an oral decongestant like Mucinex or sudafed. You may also try intranasal flonase nasal spray or saline irrigations (neti pot, sinus cleanse) ° °3. For your sore throat you may try cepacol lozenges, salt water gargles, throat spray. Treatment of congestion may also help your sore throat. ° °4. For cough you may try Robitussen, Mucinex DM ° °5. Take Tylenol or Ibuprofen to help with pain/inflammation ° °6. Stay hydrated, drink plenty of fluids to keep throat coated and less irritated ° °Honey Tea °For cough/sore throat try using a honey-based tea. Use 3 teaspoons of honey with juice squeezed from half lemon. Place shaved pieces of ginger into 1/2-1 cup of water and warm over stove top. Then mix the ingredients and repeat every 4 hours as needed. °

## 2018-05-08 NOTE — ED Provider Notes (Signed)
[facility]    CSN: [id number] Arrival date & time: 05/08/18  1019     History   Chief Complaint Chief Complaint  Patient presents with  . Cough    HPI Lori Perry is a 22 y.o. female no contributing past medical history presenting today for evaluation of cough and headache.  Patient states that she began to have a cough and headache last night.  She is also developed a sore throat.  Symptoms persisting into today.  She has had some occasional sensations of lightheadedness.  She is tried Sudafed without relief.  Headache feels like a head cold.  Denies any changes in vision, nausea or vomiting.  Denies fevers.  Requesting work note  HPI  Past Medical History:  Diagnosis Date  . Anemia   . Normal labor 10/14/2014    Patient Active Problem List   Diagnosis Date Noted  . Vaginal delivery 10/15/2014  . 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 ADD (attention deficit disorder) 10/14/2014  . 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 Anemia 10/14/2014    History reviewed. No pertinent surgical history.  OB History    Gravida  1   Para  1   Term  1   Preterm      AB      Living  1     SAB      TAB      Ectopic      Multiple  0   Live Births  1            Home Medications    Prior to Admission medications   Medication Sig Start Date End Date Taking? Authorizing Provider  norelgestromin-ethinyl estradiol Burr Medico(XULANE) 150-35 MCG/24HR transdermal patch Place 1 patch onto the skin once a week. Use for 3 weeks, then 1 week off. Please come in for a visit 04/22/18  Yes Copland, Gwenlyn FoundJessica C, MD  Cetirizine HCl 10 MG CAPS Take 1 capsule (10 mg total) by mouth daily for 10 days. 05/08/18 05/18/18  Dierks Wach C, PA-C  guaiFENesin-dextromethorphan (ROBITUSSIN DM) 100-10 MG/5ML syrup Take 5 mLs by mouth every 4 (four) hours as needed for cough. 05/08/18   Montae Stager C, PA-C  ibuprofen (ADVIL,MOTRIN) 600 MG tablet Take 1 tablet (600 mg total)  by mouth every 6 (six) hours as needed. 05/08/18   Roxana Lai, Junius CreamerHallie C, PA-C    Family History Family History  Problem Relation Age of Onset  . Hypertension Mother     Social History Social History   Tobacco Use  . Smoking status: Never Smoker  . Smokeless tobacco: Never Used  Substance Use Topics  . Alcohol use: No    Alcohol/week: 0.0 standard drinks  . Drug use: No     Allergies   Amoxicillin and Sulfa antibiotics   Review of Systems Review of Systems  Constitutional: Negative for activity change, appetite change, chills, fatigue and fever.  HENT: Positive for congestion, rhinorrhea, sinus pressure and sore throat. Negative for ear pain and trouble swallowing.   Eyes: Negative for discharge and redness.  Respiratory: Positive for cough. Negative for chest tightness and shortness of breath.   Cardiovascular: Negative for chest pain.  Gastrointestinal: Negative for abdominal pain, diarrhea, nausea and vomiting.  Musculoskeletal: Negative for myalgias.  Skin: Negative for rash.  Neurological: Positive for light-headedness and headaches. Negative for dizziness.     Physical Exam Triage Vital Signs ED Triage Vitals  Enc Vitals Group     BP 05/08/18 1129  115/88     Pulse Rate 05/08/18 1129 90     Resp 05/08/18 1129 16     Temp 05/08/18 1129 97.9 F (36.6 C)     Temp Source 05/08/18 1129 Oral     SpO2 05/08/18 1129 100 %     Weight 05/08/18 1129 130 lb (59 kg)     Height --      Head Circumference --      Peak Flow --      Pain Score 05/08/18 1128 4     Pain Loc --      Pain Edu? --      Excl. in GC? --    No data found.  Updated Vital Signs BP 115/88   Pulse 90   Temp 97.9 F (36.6 C) (Oral)   Resp 16   Wt 130 lb (59 kg)   LMP 05/01/2018   SpO2 100%   BMI 20.98 kg/m   Visual Acuity Right Eye Distance:   Left Eye Distance:   Bilateral Distance:    Right Eye Near:   Left Eye Near:    Bilateral Near:     Physical Exam  Constitutional: She  appears well-developed and well-nourished. No distress.  HENT:  Head: Normocephalic and atraumatic.  Bilateral ears without tenderness to palpation of external auricle, tragus and mastoid, EAC's without erythema or swelling, TM's with good bony landmarks and cone of light. Non erythematous.  Oral mucosa pink and moist, no tonsillar enlargement or exudate. Posterior pharynx patent and nonerythematous, no uvula deviation or swelling. Normal phonation.  Eyes: Conjunctivae are normal.  Neck: Neck supple.  Cardiovascular: Normal rate and regular rhythm.  No murmur heard. Pulmonary/Chest: Effort normal and breath sounds normal. No respiratory distress.  Breathing comfortably at rest, CTABL, no wheezing, rales or other adventitious sounds auscultated  Abdominal: Soft. There is no tenderness.  Musculoskeletal: She exhibits no edema.  Neurological: She is alert.  Skin: Skin is warm and dry.  Psychiatric: She has a normal mood and affect.  Nursing note and vitals reviewed.    UC Treatments / Results  Labs (all labs ordered are listed, but only abnormal results are displayed) Labs Reviewed - No data to display  EKG None  Radiology No results found.  Procedures Procedures (including critical care time)  Medications Ordered in UC Medications - No data to display  Initial Impression / Assessment and Plan / UC Course  I have reviewed the triage vital signs and the nursing notes.  Pertinent labs & imaging results that were available during my care of the patient were reviewed by me and considered in my medical decision making (see chart for details).    URI symptoms x1 day, vital signs stable, throat unremarkable.  Most likely viral etiology.  Will recommend symptomatic and supportive care.  Recommendations below.  Work note provided.Discussed strict return precautions. Patient verbalized understanding and is agreeable with plan.  Final Clinical Impressions(s) / UC Diagnoses   Final  diagnoses:  Viral URI with cough     Discharge Instructions     You likely having a viral upper respiratory infection. We recommended symptom control. I expect your symptoms to start improving in the next 1-2 weeks.   1. Take a daily allergy pill/anti-histamine like Zyrtec, Claritin, or Store brand consistently for 2 weeks  2. For congestion you may try an oral decongestant like Mucinex or sudafed. You may also try intranasal flonase nasal spray or saline irrigations (neti pot, sinus cleanse)  3.  For your sore throat you may try cepacol lozenges, salt water gargles, throat spray. Treatment of congestion may also help your sore throat.  4. For cough you may try Robitussen, Mucinex DM  5. Take Tylenol or Ibuprofen to help with pain/inflammation  6. Stay hydrated, drink plenty of fluids to keep throat coated and less irritated  Honey Tea For cough/sore throat try using a honey-based tea. Use 3 teaspoons of honey with juice squeezed from half lemon. Place shaved pieces of ginger into 1/2-1 cup of water and warm over stove top. Then mix the ingredients and repeat every 4 hours as needed.   ED Prescriptions    Medication Sig Dispense Auth. Provider   Cetirizine HCl 10 MG CAPS Take 1 capsule (10 mg total) by mouth daily for 10 days. 10 capsule Saif Peter C, PA-C   ibuprofen (ADVIL,MOTRIN) 600 MG tablet Take 1 tablet (600 mg total) by mouth every 6 (six) hours as needed. 30 tablet Kamela Blansett C, PA-C   guaiFENesin-dextromethorphan (ROBITUSSIN DM) 100-10 MG/5ML syrup Take 5 mLs by mouth every 4 (four) hours as needed for cough. 118 mL Lexander Tremblay C, PA-C     Controlled Substance Prescriptions [place] Controlled Substance Registry consulted? Not Applicable   Lew Dawes, [place] 05/08/18 1254

## 2018-05-08 NOTE — Telephone Encounter (Signed)
Author phoned pt. To set up appointment for 5PM today, no answer. Author left detailed VM, asking pt to return call if 5PM will not work. Chartered loss adjusterAuthor then phoned Boyd Kerbsenny, mother to notify, and mother stated "that's too late".  No other openings with other providers today. Boyd Kerbsenny stated she would take pt. To urgent care instead.

## 2018-05-08 NOTE — ED Triage Notes (Signed)
PT reports cough and headache that started last night.

## 2018-05-31 NOTE — Progress Notes (Deleted)
[facility] [address], [address] [place], [place] [address] [phone] [phone]  Date:  06/05/2018   Name:  Lori BuryDanielle E Perry   DOB:  08/22/1995   MRN:  [id number]  PCP:  Pearline Cablesopland, Jessica C, MD    Chief Complaint: No chief complaint on file.   History of Present Illness:  Lori Perry is a 22 y.o. very pleasant female patient who presents with the following:  Here today for complete physical. She delivered a baby in 2016 Upon chart review, noted that she attempted suicide via drug overdose with NyQuil about 1 year ago  Labs: Due for routine labs Pap:  Pap, chlamydia screening due Immun: Flu shot?   Patient Active Problem List   Diagnosis Date Noted  . Vaginal delivery 10/15/2014  . 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 ADD (attention deficit disorder) 10/14/2014  . 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 Anemia 10/14/2014    Past Medical History:  Diagnosis Date  . Anemia   . Normal labor 10/14/2014    No past surgical history on file.  Social History   Tobacco Use  . Smoking status: Never Smoker  . Smokeless tobacco: Never Used  Substance Use Topics  . Alcohol use: No    Alcohol/week: 0.0 standard drinks  . Drug use: No    Family History  Problem Relation Age of Onset  . Hypertension Mother     Allergies  Allergen Reactions  . Amoxicillin Rash  . Sulfa Antibiotics Rash    Medication list has been reviewed and updated.  Current Outpatient Medications on File Prior to Visit  Medication Sig Dispense Refill  . Cetirizine HCl 10 MG CAPS Take 1 capsule (10 mg total) by mouth daily for 10 days. 10 capsule 0  . guaiFENesin-dextromethorphan (ROBITUSSIN DM) 100-10 MG/5ML syrup Take 5 mLs by mouth every 4 (four) hours as needed for cough. 118 mL 0  . ibuprofen (ADVIL,MOTRIN) 600 MG tablet Take 1 tablet (600 mg total) by mouth every 6 (six) hours as needed. 30 tablet 0  . norelgestromin-ethinyl  estradiol Burr Medico(XULANE) 150-35 MCG/24HR transdermal patch Place 1 patch onto the skin once a week. Use for 3 weeks, then 1 week off. Please come in for a visit 12 patch 0   No current facility-administered medications on file prior to visit.     Review of Systems:  As per HPI- otherwise negative.   Physical Examination: There were no vitals filed for this visit. There were no vitals filed for this visit. There is no height or weight on file to calculate BMI. Ideal Body Weight:    GEN: WDWN, NAD, Non-toxic, A & O x 3 HEENT: Atraumatic, Normocephalic. Neck supple. No masses, No LAD. Ears and Nose: No external deformity. CV: RRR, No M/G/R. No JVD. No thrill. No extra heart sounds. PULM: CTA B, no wheezes, crackles, rhonchi. No retractions. No resp. distress. No accessory muscle use. ABD: S, NT, ND, +BS. No rebound. No HSM. EXTR: No c/c/e NEURO Normal gait.  PSYCH: Normally interactive. Conversant. Not depressed or anxious appearing.  Calm demeanor.    Assessment and Plan: ***  Signed Abbe AmsterdamJessica Copland, MD

## 2018-06-05 ENCOUNTER — Encounter: Payer: Self-pay | Admitting: Family Medicine

## 2018-06-06 NOTE — Progress Notes (Deleted)
[facility] at [facility] [address], [address] [place], [place] [address] [phone]  Date:  06/09/2018   Name:  Lori Perry   DOB:  [birth year]   MRN:  [id number]  PCP:  Pearline Cables, MD    Chief Complaint: No chief complaint on file.   History of Present Illness:  Lori Perry is a 23 y.o. very pleasant female patient who presents with the following:  Here today for complete physical exam.  History of ADD.  She had a baby in 2016 I last saw her in December 2018.  Following this she did have a suicide attempt/gesture when she drank a bottle of NyQuil.  She was admitted to a psychiatric hospital at that time    Patient Active Problem List   Diagnosis Date Noted  . Vaginal delivery 10/15/2014  . 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 ADD (attention deficit disorder) 10/14/2014  . 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 Anemia 10/14/2014    Past Medical History:  Diagnosis Date  . Anemia   . Normal labor 10/14/2014    No past surgical history on file.  Social History   Tobacco Use  . Smoking status: Never Smoker  . Smokeless tobacco: Never Used  Substance Use Topics  . Alcohol use: No    Alcohol/week: 0.0 standard drinks  . Drug use: No    Family History  Problem Relation Age of Onset  . Hypertension Mother     Allergies  Allergen Reactions  . Amoxicillin Rash  . Sulfa Antibiotics Rash    Medication list has been reviewed and updated.  Current Outpatient Medications on File Prior to Visit  Medication Sig Dispense Refill  . Cetirizine HCl 10 MG CAPS Take 1 capsule (10 mg total) by mouth daily for 10 days. 10 capsule 0  . guaiFENesin-dextromethorphan (ROBITUSSIN DM) 100-10 MG/5ML syrup Take 5 mLs by mouth every 4 (four) hours as needed for cough. 118 mL 0  . ibuprofen (ADVIL,MOTRIN) 600 MG tablet Take 1 tablet (600 mg total) by mouth every 6 (six) hours as needed. 30 tablet 0  .  norelgestromin-ethinyl estradiol Burr Medico) 150-35 MCG/24HR transdermal patch Place 1 patch onto the skin once a week. Use for 3 weeks, then 1 week off. Please come in for a visit 12 patch 0   No current facility-administered medications on file prior to visit.     Review of Systems:  As per HPI- otherwise negative.   Physical Examination: There were no vitals filed for this visit. There were no vitals filed for this visit. There is no height or weight on file to calculate BMI. Ideal Body Weight:    GEN: WDWN, NAD, Non-toxic, A & O x 3 HEENT: Atraumatic, Normocephalic. Neck supple. No masses, No LAD. Ears and Nose: No external deformity. CV: RRR, No M/G/R. No JVD. No thrill. No extra heart sounds. PULM: CTA B, no wheezes, crackles, rhonchi. No retractions. No resp. distress. No accessory muscle use. ABD: S, NT, ND, +BS. No rebound. No HSM. EXTR: No c/c/e NEURO Normal gait.  PSYCH: Normally interactive. Conversant. Not depressed or anxious appearing.  Calm demeanor.    Assessment and Plan: ***  Signed Abbe Amsterdam, MD

## 2018-06-09 ENCOUNTER — Encounter: Payer: 59 | Admitting: Family Medicine

## 2018-06-09 DIAGNOSIS — Z0289 Encounter for other administrative examinations: Secondary | ICD-10-CM

## 2018-06-10 MED FILL — XULANE PATCH: 150-35 | 28 days supply | Qty: 3 | Fill #1

## 2018-06-10 NOTE — Progress Notes (Signed)
[facility] at [facility] [address] [place], [place] [address] [phone]  Date:  06/12/2018   Name:  Lori Perry   DOB:  [birth year]   MRN:  [id number]  PCP:  Pearline Cables, MD    Chief Complaint: Annual Exam   History of Present Illness:  Lori Perry is a 23 y.o. very pleasant female patient who presents with the following:  Here today for complete physical I last saw her little over a year ago for a physical.  At that time she to be doing well, but a few days later made a possible suicide attempt by drinking most a bottle of NyQuil.  I have not seen her since that time. She has broken up with her BF and is doing much better-she no longer suffers from any suicidal ideation.  She feels like her mood is good  She and her son are currently living with her mother.  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  She has done this for about 3 years now, she does not like working nights but is otherwise a good job  Her son will be 85 yo soon  Her last pap was about 2 years ago- she declines today   Never had an abnormal Labs are due today   She declines a flu shot today  She is on contraceptive patch and allergy pill  She does not need any refills today  Declines STI screening today  She feels like her mood is back to normal  She does not smoke or drink significant alcohol Denies any other concerns today   Patient Active Problem List   Diagnosis Date Noted  . Vaginal delivery 10/15/2014  . 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 ADD (attention deficit disorder) 10/14/2014  . 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 Anemia 10/14/2014    Past Medical History:  Diagnosis Date  . Anemia   . Normal labor 10/14/2014    No past surgical history on file.  Social History   Tobacco Use  . Smoking status: Never Smoker  . Smokeless tobacco: Never Used  Substance Use Topics  . Alcohol use: No   Alcohol/week: 0.0 standard drinks  . Drug use: No    Family History  Problem Relation Age of Onset  . Hypertension Mother     Allergies  Allergen Reactions  . Nitrofurantoin Macrocrystal   . Amoxicillin Rash  . Sulfa Antibiotics Rash    Medication list has been reviewed and updated.  Current Outpatient Medications on File Prior to Visit  Medication Sig Dispense Refill  . norelgestromin-ethinyl estradiol Burr Medico) 150-35 MCG/24HR transdermal patch Place 1 patch onto the skin once a week. Use for 3 weeks, then 1 week off. Please come in for a visit 12 patch 0  . Cetirizine HCl 10 MG CAPS Take 1 capsule (10 mg total) by mouth daily for 10 days. 10 capsule 0   No current facility-administered medications on file prior to visit.     Review of Systems:  As per HPI- otherwise negative.   Physical Examination: Vitals:   06/12/18 1405  BP: 104/70  Pulse: 87  Resp: 16  Temp: 98.2 F (36.8 C)  SpO2: 98%   Vitals:   06/12/18 1405  Weight: 133 lb (60.3 kg)  Height: 5\' 6"  (1.676 m)   Body mass index is 21.47 kg/m. Ideal Body Weight: Weight in (lb) to have BMI = 25: 154.6  GEN:  WDWN, NAD, Non-toxic, A & O x 3, normal weight, looks well HEENT: Atraumatic, Normocephalic. Neck supple. No masses, No LAD.  Bilateral TM wnl, oropharynx normal.  PEERL,EOMI.   Ears and Nose: No external deformity. CV: RRR, No M/G/R. No JVD. No thrill. No extra heart sounds. PULM: CTA B, no wheezes, crackles, rhonchi. No retractions. No resp. distress. No accessory muscle use. ABD: S, NT, ND, +BS. No rebound. No HSM. EXTR: No c/c/e NEURO Normal gait.  PSYCH: Normally interactive. Conversant. Not depressed or anxious appearing.  Calm demeanor.  Breast: normal exam, no masses/ dimpling/ discharge   Assessment and Plan: Physical exam  Screening for deficiency anemia - Plan: CBC  Screening for diabetes mellitus - Plan: Comprehensive metabolic panel, Hemoglobin A1c  Screening for hyperlipidemia  - Plan: Lipid panel  Here today for physical exam. Lori Perry is currently doing well, she and her young son are living with her mother.  She currently has no depression symptoms, denies any suicidal ideation. She declines a Pap or flu shot today, declines STI screening.  Will obtain screening labs for as above, and be in touch with her pending these results ASAP  Signed Abbe Amsterdam, MD Received her labs as below, message to patient  Results for orders placed or performed in visit on 06/12/18  CBC  Result Value Ref Range   WBC 8.5 4.0 - 10.5 K/uL   RBC 4.64 3.87 - 5.11 Mil/uL   Platelets 260.0 150.0 - 400.0 K/uL   Hemoglobin 12.7 12.0 - 15.0 g/dL   HCT 16.1 09.6 - 04.5 %   MCV 84.4 78.0 - 100.0 fl   MCHC 32.6 30.0 - 36.0 g/dL   RDW 40.9 (H) 81.1 - 91.4 %  Comprehensive metabolic panel  Result Value Ref Range   Sodium 138 135 - 145 mEq/L   Potassium 4.0 3.5 - 5.1 mEq/L   Chloride 104 96 - 112 mEq/L   CO2 27 19 - 32 mEq/L   Glucose, Bld 79 70 - 99 mg/dL   BUN 8 6 - 23 mg/dL   Creatinine, Ser 7.82 0.40 - 1.20 mg/dL   Total Bilirubin 0.4 0.2 - 1.2 mg/dL   Alkaline Phosphatase 58 39 - 117 U/L   AST 16 0 - 37 U/L   ALT 12 0 - 35 U/L   Total Protein 6.6 6.0 - 8.3 g/dL   Albumin 4.1 3.5 - 5.2 g/dL   Calcium 9.1 8.4 - 95.6 mg/dL   GFR 213.08 >65.78 mL/min  Hemoglobin A1c  Result Value Ref Range   Hgb A1c MFr Bld 5.5 4.6 - 6.5 %  Lipid panel  Result Value Ref Range   Cholesterol 133 0 - 200 mg/dL   Triglycerides 46.9 0.0 - 149.0 mg/dL   HDL 62.95 >28.41 mg/dL   VLDL 7.8 0.0 - 32.4 mg/dL   LDL Cholesterol 78 0 - 99 mg/dL   Total CHOL/HDL Ratio 3    NonHDL 85.47

## 2018-06-12 ENCOUNTER — Encounter: Payer: Self-pay | Admitting: Family Medicine

## 2018-06-12 ENCOUNTER — Ambulatory Visit (INDEPENDENT_AMBULATORY_CARE_PROVIDER_SITE_OTHER): Payer: 59 | Admitting: Family Medicine

## 2018-06-12 VITALS — BP 104/70 | HR 87 | Temp 98.2°F | Resp 16 | Ht 66.0 in | Wt 133.0 lb

## 2018-06-12 DIAGNOSIS — Z13220 Encounter for screening for lipoid disorders: Secondary | ICD-10-CM

## 2018-06-12 DIAGNOSIS — Z130 Encounter for screening for diseases of the blood and blood-forming organs and certain disorders involving the immune mechanism: Secondary | ICD-10-CM

## 2018-06-12 DIAGNOSIS — Z0000 Encounter for general adult medical examination without abnormal findings: Secondary | ICD-10-CM

## 2018-06-12 DIAGNOSIS — Z131 Encounter for screening for diabetes mellitus: Secondary | ICD-10-CM | POA: Diagnosis not present

## 2018-06-12 LAB — COMPREHENSIVE METABOLIC PANEL
ALT: 12 U/L (ref 0–35)
AST: 16 U/L (ref 0–37)
Albumin: 4.1 g/dL (ref 3.5–5.2)
Alkaline Phosphatase: 58 U/L (ref 39–117)
BILIRUBIN TOTAL: 0.4 mg/dL (ref 0.2–1.2)
BUN: 8 mg/dL (ref 6–23)
CALCIUM: 9.1 mg/dL (ref 8.4–10.5)
CO2: 27 meq/L (ref 19–32)
CREATININE: 0.72 mg/dL (ref 0.40–1.20)
Chloride: 104 mEq/L (ref 96–112)
GFR: 105.76 mL/min (ref >60.00)
GLUCOSE: 79 mg/dL (ref 70–99)
Potassium: 4 mEq/L (ref 3.5–5.1)
Sodium: 138 mEq/L (ref 135–145)
Total Protein: 6.6 g/dL (ref 6.0–8.3)

## 2018-06-12 LAB — CBC
HCT: 39.2 % (ref 36.0–46.0)
Hemoglobin: 12.7 g/dL (ref 12.0–15.0)
MCHC: 32.6 g/dL (ref 30.0–36.0)
MCV: 84.4 fl (ref 78.0–100.0)
PLATELETS: 260 10*3/uL (ref 150.0–400.0)
RBC: 4.64 Mil/uL (ref 3.87–5.11)
RDW: 16 % — AB (ref 11.5–15.5)
WBC: 8.5 10*3/uL (ref 4.0–10.5)

## 2018-06-12 LAB — LIPID PANEL
CHOL/HDL RATIO: 3
Cholesterol: 133 mg/dL (ref 0–200)
HDL: 47.9 mg/dL (ref >39.00)
LDL CALC: 78 mg/dL (ref 0–99)
NONHDL: 85.47
Triglycerides: 39 mg/dL (ref 0.0–149.0)
VLDL: 7.8 mg/dL (ref 0.0–40.0)

## 2018-06-12 LAB — HEMOGLOBIN A1C: Hgb A1c MFr Bld: 5.5 % (ref 4.6–6.5)

## 2018-06-12 NOTE — Patient Instructions (Signed)
It was great to see you today, I am glad things are going well for you. I will be in touch with your labs ASAP. Please let me know if you have any concerns, otherwise we can plan to visit about 1 month.  I would encourage you to have a Pap done at your next visit  Let me know if you need a refill on your contraceptive patch in the meantime  Health Maintenance, Female Adopting a healthy lifestyle and getting preventive care can go a long way to promote health and wellness. Talk with your health care provider about what schedule of regular examinations is right for you. This is a good chance for you to check in with your provider about disease prevention and staying healthy. In between checkups, there are plenty of things you can do on your own. Experts have done a lot of research about which lifestyle changes and preventive measures are most likely to keep you healthy. Ask your health care provider for more information. Weight and diet Eat a healthy diet  Be sure to include plenty of vegetables, fruits, low-fat dairy products, and lean protein.  Do not eat a lot of foods high in solid fats, added sugars, or salt.  Get regular exercise. This is one of the most important things you can do for your health. ? Most adults should exercise for at least 150 minutes each week. The exercise should increase your heart rate and make you sweat (moderate-intensity exercise). ? Most adults should also do strengthening exercises at least twice a week. This is in addition to the moderate-intensity exercise. Maintain a healthy weight  Body mass index (BMI) is a measurement that can be used to identify possible weight problems. It estimates body fat based on height and weight. Your health care provider can help determine your BMI and help you achieve or maintain a healthy weight.  For females 67 years of age and older: ? A BMI below 18.5 is considered underweight. ? A BMI of 18.5 to 24.9 is normal. ? A BMI of  25 to 29.9 is considered overweight. ? A BMI of 30 and above is considered obese. Watch levels of cholesterol and blood lipids  You should start having your blood tested for lipids and cholesterol at 24 years of age, then have this test every 5 years.  You may need to have your cholesterol levels checked more often if: ? Your lipid or cholesterol levels are high. ? You are older than 24 years of age. ? You are at high risk for heart disease. Cancer screening Lung Cancer  Lung cancer screening is recommended for adults 85-61 years old who are at high risk for lung cancer because of a history of smoking.  A yearly low-dose CT scan of the lungs is recommended for people who: ? Currently smoke. ? Have quit within the past 15 years. ? Have at least a 30-pack-year history of smoking. A pack year is smoking an average of one pack of cigarettes a day for 1 year.  Yearly screening should continue until it has been 15 years since you quit.  Yearly screening should stop if you develop a health problem that would prevent you from having lung cancer treatment. Breast Cancer  Practice breast self-awareness. This means understanding how your breasts normally appear and feel.  It also means doing regular breast self-exams. Let your health care provider know about any changes, no matter how small.  If you are in your 20s or 30s,  you should have a clinical breast exam (CBE) by a health care provider every 1-3 years as part of a regular health exam.  If you are 39 or older, have a CBE every year. Also consider having a breast X-ray (mammogram) every year.  If you have a family history of breast cancer, talk to your health care provider about genetic screening.  If you are at high risk for breast cancer, talk to your health care provider about having an MRI and a mammogram every year.  Breast cancer gene (BRCA) assessment is recommended for women who have family members with BRCA-related cancers.  BRCA-related cancers include: ? Breast. ? Ovarian. ? Tubal. ? Peritoneal cancers.  Results of the assessment will determine the need for genetic counseling and BRCA1 and BRCA2 testing. Cervical Cancer Your health care provider may recommend that you be screened regularly for cancer of the pelvic organs (ovaries, uterus, and vagina). This screening involves a pelvic examination, including checking for microscopic changes to the surface of your cervix (Pap test). You may be encouraged to have this screening done every 3 years, beginning at age 52.  For women ages 39-65, health care providers may recommend pelvic exams and Pap testing every 3 years, or they may recommend the Pap and pelvic exam, combined with testing for human papilloma virus (HPV), every 5 years. Some types of HPV increase your risk of cervical cancer. Testing for HPV may also be done on women of any age with unclear Pap test results.  Other health care providers may not recommend any screening for nonpregnant women who are considered low risk for pelvic cancer and who do not have symptoms. Ask your health care provider if a screening pelvic exam is right for you.  If you have had past treatment for cervical cancer or a condition that could lead to cancer, you need Pap tests and screening for cancer for at least 20 years after your treatment. If Pap tests have been discontinued, your risk factors (such as having a new sexual partner) need to be reassessed to determine if screening should resume. Some women have medical problems that increase the chance of getting cervical cancer. In these cases, your health care provider may recommend more frequent screening and Pap tests. Colorectal Cancer  This type of cancer can be detected and often prevented.  Routine colorectal cancer screening usually begins at 24 years of age and continues through 24 years of age.  Your health care provider may recommend screening at an earlier age if you  have risk factors for colon cancer.  Your health care provider may also recommend using home test kits to check for hidden blood in the stool.  A small camera at the end of a tube can be used to examine your colon directly (sigmoidoscopy or colonoscopy). This is done to check for the earliest forms of colorectal cancer.  Routine screening usually begins at age 59.  Direct examination of the colon should be repeated every 5-10 years through 24 years of age. However, you may need to be screened more often if early forms of precancerous polyps or small growths are found. Skin Cancer  Check your skin from head to toe regularly.  Tell your health care provider about any new moles or changes in moles, especially if there is a change in a mole's shape or color.  Also tell your health care provider if you have a mole that is larger than the size of a pencil eraser.  Always use  sunscreen. Apply sunscreen liberally and repeatedly throughout the day.  Protect yourself by wearing long sleeves, pants, a wide-brimmed hat, and sunglasses whenever you are outside. Heart disease, diabetes, and high blood pressure  High blood pressure causes heart disease and increases the risk of stroke. High blood pressure is more likely to develop in: ? People who have blood pressure in the high end of the normal range (130-139/85-89 mm Hg). ? People who are overweight or obese. ? People who are African American.  If you are 60-53 years of age, have your blood pressure checked every 3-5 years. If you are 22 years of age or older, have your blood pressure checked every year. You should have your blood pressure measured twice-once when you are at a hospital or clinic, and once when you are not at a hospital or clinic. Record the average of the two measurements. To check your blood pressure when you are not at a hospital or clinic, you can use: ? An automated blood pressure machine at a pharmacy. ? A home blood pressure  monitor.  If you are between 82 years and 35 years old, ask your health care provider if you should take aspirin to prevent strokes.  Have regular diabetes screenings. This involves taking a blood sample to check your fasting blood sugar level. ? If you are at a normal weight and have a low risk for diabetes, have this test once every three years after 24 years of age. ? If you are overweight and have a high risk for diabetes, consider being tested at a younger age or more often. Preventing infection Hepatitis B  If you have a higher risk for hepatitis B, you should be screened for this virus. You are considered at high risk for hepatitis B if: ? You were born in a country where hepatitis B is common. Ask your health care provider which countries are considered high risk. ? Your parents were born in a high-risk country, and you have not been immunized against hepatitis B (hepatitis B vaccine). ? You have HIV or AIDS. ? You use needles to inject street drugs. ? You live with someone who has hepatitis B. ? You have had sex with someone who has hepatitis B. ? You get hemodialysis treatment. ? You take certain medicines for conditions, including cancer, organ transplantation, and autoimmune conditions. Hepatitis C  Blood testing is recommended for: ? Everyone born from [birth year] through 1966. ? Anyone with known risk factors for hepatitis C. Sexually transmitted infections (STIs)  You should be screened for sexually transmitted infections (STIs) including gonorrhea and chlamydia if: ? You are sexually active and are younger than 24 years of age. ? You are older than 24 years of age and your health care provider tells you that you are at risk for this type of infection. ? Your sexual activity has changed since you were last screened and you are at an increased risk for chlamydia or gonorrhea. Ask your health care provider if you are at risk.  If you do not have HIV, but are at risk, it may be  recommended that you take a prescription medicine daily to prevent HIV infection. This is called pre-exposure prophylaxis (PrEP). You are considered at risk if: ? You are sexually active and do not regularly use condoms or know the HIV status of your partner(s). ? You take drugs by injection. ? You are sexually active with a partner who has HIV. Talk with your health care provider about whether  you are at high risk of being infected with HIV. If you choose to begin PrEP, you should first be tested for HIV. You should then be tested every 3 months for as long as you are taking PrEP. Pregnancy  If you are premenopausal and you may become pregnant, ask your health care provider about preconception counseling.  If you may become pregnant, take 400 to 800 micrograms (mcg) of folic acid every day.  If you want to prevent pregnancy, talk to your health care provider about birth control (contraception). Osteoporosis and menopause  Osteoporosis is a disease in which the bones lose minerals and strength with aging. This can result in serious bone fractures. Your risk for osteoporosis can be identified using a bone density scan.  If you are 69 years of age or older, or if you are at risk for osteoporosis and fractures, ask your health care provider if you should be screened.  Ask your health care provider whether you should take a calcium or vitamin D supplement to lower your risk for osteoporosis.  Menopause may have certain physical symptoms and risks.  Hormone replacement therapy may reduce some of these symptoms and risks. Talk to your health care provider about whether hormone replacement therapy is right for you. Follow these instructions at home:  Schedule regular health, dental, and eye exams.  Stay current with your immunizations.  Do not use any tobacco products including cigarettes, chewing tobacco, or electronic cigarettes.  If you are pregnant, do not drink alcohol.  If you are  breastfeeding, limit how much and how often you drink alcohol.  Limit alcohol intake to no more than 1 drink per day for nonpregnant women. One drink equals 12 ounces of beer, 5 ounces of wine, or 1 ounces of hard liquor.  Do not use street drugs.  Do not share needles.  Ask your health care provider for help if you need support or information about quitting drugs.  Tell your health care provider if you often feel depressed.  Tell your health care provider if you have ever been abused or do not feel safe at home. This information is not intended to replace advice given to you by your health care provider. Make sure you discuss any questions you have with your health care provider. Document Released: 12/04/2010 Document Revised: 10/27/2015 Document Reviewed: 02/22/2015 Elsevier Interactive Patient Education  2019 [employer].

## 2018-07-03 MED FILL — XULANE PATCH: 150-35 | 28 days supply | Qty: 3 | Fill #2

## 2018-08-20 MED FILL — XULANE PATCH: 150-35 | 28 days supply | Qty: 3 | Fill #3

## 2018-11-27 ENCOUNTER — Other Ambulatory Visit: Payer: Self-pay

## 2018-11-28 ENCOUNTER — Other Ambulatory Visit: Payer: Self-pay

## 2018-11-28 ENCOUNTER — Encounter: Payer: Self-pay | Admitting: Family Medicine

## 2018-11-28 ENCOUNTER — Ambulatory Visit: Payer: 59 | Admitting: Family Medicine

## 2018-11-28 VITALS — BP 108/78 | HR 110 | Temp 98.3°F | Ht 66.0 in | Wt 130.1 lb

## 2018-11-28 DIAGNOSIS — H6123 Impacted cerumen, bilateral: Secondary | ICD-10-CM

## 2018-11-28 NOTE — Progress Notes (Signed)
Chief Complaint  Patient presents with  . Ear Fullness    Subjective: Patient is a 23 y.o. female here for ear fullness.  2 d ago started having R ear fullness/decreased hearing. Hx of wax buildup. Does not use Q tips any longer. No pain, drainage, fevers, hx of allergies or recent URI.   ROS: HEENT: +ear fullness  Past Medical History:  Diagnosis Date  . Anemia   . Normal labor 10/14/2014    Objective: BP 108/78 (BP Location: Left Arm, Patient Position: Sitting, Cuff Size: Normal)   Pulse (!) 110   Temp 98.3 F (36.8 C) (Oral)   Ht 5\' 6"  (1.676 m)   Wt 130 lb 2 oz (59 kg)   SpO2 98%   BMI 21.00 kg/m  General: Awake, appears stated age HEENT: No ttp, canals 100% obstructed with cerumen b/l Lungs: No accessory muscle use Psych: Age appropriate judgment and insight, normal affect and mood  Procedure note: Cerumen removal irrigation Verbal consent obtained. Robin Ewing, CMA performed procedure. A mixture of warm water and Dulcolax was used to irrigate ear. Cerumen successfully removed. Pt reported immediate improvement. Pt tolerated procedure well. There were no immediate complications noted.  Assessment and Plan: Bilateral impacted cerumen - Plan: removal in clinic. Home removal info provided.   F/u prn.  The patient voiced understanding and agreement to the plan.  [place], DO 11/28/18  8:29 AM

## 2018-11-28 NOTE — Patient Instructions (Addendum)
OK to use Debrox (peroxide) in the ear to loosen up wax. Also recommend using a bulb syringe (for removing boogers from baby's noses) to flush through warm water. Do not use Q-tips as this can impact wax further.  Let us know if you need anything. 

## 2019-01-22 ENCOUNTER — Other Ambulatory Visit: Payer: Self-pay | Admitting: Family Medicine

## 2019-01-22 MED FILL — XULANE PATCH: 150-35 | 84 days supply | Qty: 9 | Fill #0

## 2019-04-28 MED FILL — XULANE PATCH: 150-35 | 84 days supply | Qty: 9 | Fill #1

## 2019-05-11 ENCOUNTER — Other Ambulatory Visit: Payer: Self-pay

## 2019-05-11 ENCOUNTER — Ambulatory Visit (HOSPITAL_COMMUNITY)
Admission: EM | Admit: 2019-05-11 | Discharge: 2019-05-11 | Disposition: A | Discharge status: Home / Self Care | Payer: 59 | Attending: Family Medicine | Admitting: Family Medicine

## 2019-05-11 ENCOUNTER — Encounter (HOSPITAL_COMMUNITY): Payer: Self-pay

## 2019-05-11 ENCOUNTER — Ambulatory Visit (INDEPENDENT_AMBULATORY_CARE_PROVIDER_SITE_OTHER): Payer: 59

## 2019-05-11 DIAGNOSIS — R1012 Left upper quadrant pain: Secondary | ICD-10-CM

## 2019-05-11 DIAGNOSIS — R079 Chest pain, unspecified: Secondary | ICD-10-CM | POA: Diagnosis not present

## 2019-05-11 MED ORDER — OMEPRAZOLE 20 MG PO CPDR: 20.0000 mg | DELAYED_RELEASE_CAPSULE | Freq: Every day | ORAL | 0 refills | Status: DC

## 2019-05-11 MED ORDER — IBUPROFEN 800 MG PO TABS
ORAL_TABLET | ORAL | Status: AC
  Filled 2019-05-11: qty 1

## 2019-05-11 MED ORDER — LIDOCAINE VISCOUS HCL 2 % MT SOLN
15.0000 mL | Freq: Once | OROMUCOSAL | Status: AC
  Administered 2019-05-11: 11:00:00 15 mL via ORAL

## 2019-05-11 MED ORDER — IBUPROFEN 800 MG PO TABS
800.0000 mg | ORAL_TABLET | Freq: Once | ORAL | Status: AC
  Administered 2019-05-11: 11:00:00 800 mg via ORAL

## 2019-05-11 MED ORDER — ALUM & MAG HYDROXIDE-SIMETH 200-200-20 MG/5ML PO SUSP
30.0000 mL | Freq: Once | ORAL | Status: AC
  Administered 2019-05-11: 11:00:00 30 mL via ORAL

## 2019-05-11 MED ORDER — DOCUSATE SODIUM 100 MG PO CAPS: 100.0000 mg | ORAL_CAPSULE | Freq: Two times a day (BID) | ORAL | 0 refills | Status: AC

## 2019-05-11 MED ORDER — LIDOCAINE VISCOUS HCL 2 % MT SOLN
OROMUCOSAL | Status: AC
  Filled 2019-05-11: qty 15

## 2019-05-11 MED ORDER — ALUM & MAG HYDROXIDE-SIMETH 200-200-20 MG/5ML PO SUSP
ORAL | Status: AC
  Filled 2019-05-11: qty 30

## 2019-05-11 MED FILL — OMEPRAZOLE 20 MG CAP: 20 | 30 days supply | Qty: 30 | Fill #0

## 2019-05-11 MED FILL — DOCUSATE NA 100 MG SOFTGEL: 100 | 50 days supply | Qty: 100 | Fill #0

## 2019-05-11 NOTE — ED Provider Notes (Signed)
[facility]    CSN: [id number] Arrival date & time: 05/11/19  6967      History   Chief Complaint Chief Complaint  Patient presents with  . Chest Pain    Left  . Abdominal Pain    Left    HPI Lori Perry is a 23 y.o. female.   Lori Perry presents with complaints of chest pain and left upper abdomen and abck pain . Started 12/4. Took gas x which hasn't helped. No worse, no better. Pain is constant. Movement seems to worsen it. Denies any previous similar. No shortness of breath. Some sensation of heart beating faster than normal. No nausea or vomiting. Eating regularly. No dizziness. No diarrhea. No fevers. No cough congestion or sore throat. No other medications for symptoms. No leg pain, no recent travel, doesn't smoke. No pain with breathing or dyspnea. She is on birth control. Endorses constipation.    ROS per HPI, negative if not otherwise mentioned.      Past Medical History:  Diagnosis Date  . Anemia   . Normal labor 10/14/2014    Patient Active Problem List   Diagnosis Date Noted  . Vaginal delivery 10/15/2014  . 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 ADD (attention deficit disorder) 10/14/2014  . 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 Anemia 10/14/2014    History reviewed. No pertinent surgical history.  OB History    Gravida  1   Para  1   Term  1   Preterm      AB      Living  1     SAB      TAB      Ectopic      Multiple  0   Live Births  1            Home Medications    Prior to Admission medications   Medication Sig Start Date End Date Taking? Authorizing Provider  Cetirizine HCl 10 MG CAPS Take 1 capsule (10 mg total) by mouth daily for 10 days. 05/08/18 05/18/18  Wieters, Hallie C, PA-C  docusate sodium (COLACE) 100 MG capsule Take 1 capsule (100 mg total) by mouth every 12 (twelve) hours. 05/11/19   Georgetta Haber, NP  omeprazole (PRILOSEC) 20 MG capsule Take 1 capsule (20 mg total) by  mouth daily. 05/11/19   Georgetta Haber, NP  Burr Medico 150-35 MCG/24HR transdermal patch PLACE 1 PATCH ONTO THE SKIN ONCE A WEEK. USE FOR 3 WEEKS, THEN 1 WEEK OFF. PLEASE COME IN FOR A VISIT 01/22/19   Copland, Gwenlyn Found, MD    Family History Family History  Problem Relation Age of Onset  . Hypertension Mother     Social History Social History   Tobacco Use  . Smoking status: Never Smoker  . Smokeless tobacco: Never Used  Substance Use Topics  . Alcohol use: No    Alcohol/week: 0.0 standard drinks  . Drug use: No     Allergies   Nitrofurantoin macrocrystal, Amoxicillin, and Sulfa antibiotics   Review of Systems Review of Systems   Physical Exam Triage Vital Signs ED Triage Vitals  Enc Vitals Group     BP 05/11/19 1040 125/72     Pulse Rate 05/11/19 1040 100     Resp 05/11/19 1040 16     Temp 05/11/19 1040 98.4 F (36.9 C)     Temp Source 05/11/19 1040 Oral     SpO2 05/11/19 1040 100 %  Weight --      Height --      Head Circumference --      Peak Flow --      Pain Score 05/11/19 1041 5     Pain Loc --      Pain Edu? --      Excl. in GC? --    No data found.  Updated Vital Signs BP 125/72 (BP Location: Right Arm)   Pulse 100   Temp 98.4 F (36.9 C) (Oral)   Resp 16   LMP 04/28/2019   SpO2 100%   Visual Acuity Right Eye Distance:   Left Eye Distance:   Bilateral Distance:    Right Eye Near:   Left Eye Near:    Bilateral Near:     Physical Exam Constitutional:      General: She is not in acute distress.    Appearance: She is well-developed.  Cardiovascular:     Rate and Rhythm: Normal rate and regular rhythm.     Heart sounds: Normal heart sounds.  Pulmonary:     Effort: Pulmonary effort is normal.     Breath sounds: Normal breath sounds.  Chest:     Comments: Indicates central sternal chest pain with mild pain on palpation as well  Abdominal:     Palpations: Abdomen is soft.     Tenderness: There is abdominal tenderness in the  epigastric area and left upper quadrant. There is no guarding or rebound.       Comments: Left upper, lateral abdomen with tenderness on palpation  Skin:    General: Skin is warm and dry.  Neurological:     Mental Status: She is alert and oriented to person, place, and time.    EKG:  NSR rate of 99 . Previous EKG was available for review. No stwave changes as interpreted by me.    UC Treatments / Results  Labs (all labs ordered are listed, but only abnormal results are displayed) Labs Reviewed - No data to display  EKG   Radiology Dg Abd Acute W/chest  Result Date: 05/11/2019 CLINICAL DATA:  Left upper quadrant abdominal and chest pain for 3 days. EXAM: DG ABDOMEN ACUTE W/ 1V CHEST COMPARISON:  None. FINDINGS: The heart size and mediastinal contours are normal. The lungs are clear. There is no pleural effusion or pneumothorax. No acute osseous findings are identified. The bowel gas pattern is normal. There is no free intraperitoneal air or suspicious abdominal calcification. Mildly prominent stool noted in the right colon and rectum. IMPRESSION: No active cardiopulmonary or abdominal process. Mildly prominent stool in the right colon and rectum. Electronically Signed   By: Carey BullocksWilliam  Veazey M.D.   On: 05/11/2019 11:48    Procedures Procedures (including critical care time)  Medications Ordered in UC Medications  alum & mag hydroxide-simeth (MAALOX/MYLANTA) 200-200-20 MG/5ML suspension 30 mL (30 mLs Oral Given 05/11/19 1120)    And  lidocaine (XYLOCAINE) 2 % viscous mouth solution 15 mL (15 mLs Oral Given 05/11/19 1120)  ibuprofen (ADVIL) tablet 800 mg (800 mg Oral Given 05/11/19 1120)  ibuprofen (ADVIL) 800 MG tablet (has no administration in time range)  alum & mag hydroxide-simeth (MAALOX/MYLANTA) 200-200-20 MG/5ML suspension (has no administration in time range)  lidocaine (XYLOCAINE) 2 % viscous mouth solution (has no administration in time range)    Initial Impression /  Assessment and Plan / UC Course  I have reviewed the triage vital signs and the nursing notes.  Pertinent labs & imaging  results that were available during my care of the patient were reviewed by me and considered in my medical decision making (see chart for details).     Notable stool burden on films today with gas even to LUQ noted. Patient is non toxic in appearance. Afebrile. Non red flag findings on exam. No risk factors for ACS, birth control but otherwise no indication of PE. Suspect gas and stool contributing to current symptoms with treatments discussed. Return precautions provided. Patient verbalized understanding and agreeable to plan.   Final Clinical Impressions(s) / UC Diagnoses   Final diagnoses:  Left upper quadrant abdominal pain  Chest pain, unspecified type     Discharge Instructions     You do have a large amount of stool and gas noted on films today, which I suspect is source of your pain.  Please start a daily or twice daily stool softener to promote regular bowel movements.  Increase your water intake.  You may start daily omeprazole as well to see if this is helpful with your chest pain.  If symptoms worsen or do not improve in the next week to return to be seen or to follow up with your PCP.      ED Prescriptions    Medication Sig Dispense Auth. Provider   docusate sodium (COLACE) 100 MG capsule Take 1 capsule (100 mg total) by mouth every 12 (twelve) hours. 60 capsule Augusto Gamble B, NP   omeprazole (PRILOSEC) 20 MG capsule Take 1 capsule (20 mg total) by mouth daily. 30 capsule Zigmund Gottron, NP     PDMP not reviewed this encounter.   Zigmund Gottron, NP 05/11/19 1159

## 2019-05-11 NOTE — ED Triage Notes (Signed)
Pt presents with left chest pain and left upper abdominal pain since Friday.

## 2019-05-11 NOTE — Discharge Instructions (Signed)
You do have a large amount of stool and gas noted on films today, which I suspect is source of your pain.  Please start a daily or twice daily stool softener to promote regular bowel movements.  Increase your water intake.  You may start daily omeprazole as well to see if this is helpful with your chest pain.  If symptoms worsen or do not improve in the next week to return to be seen or to follow up with your PCP.

## 2019-05-13 ENCOUNTER — Other Ambulatory Visit: Payer: Self-pay | Admitting: Family Medicine

## 2019-05-13 ENCOUNTER — Encounter: Payer: Self-pay | Admitting: Family Medicine

## 2019-05-13 ENCOUNTER — Encounter (HOSPITAL_BASED_OUTPATIENT_CLINIC_OR_DEPARTMENT_OTHER): Payer: Self-pay | Admitting: Emergency Medicine

## 2019-05-13 ENCOUNTER — Other Ambulatory Visit: Payer: Self-pay

## 2019-05-13 ENCOUNTER — Emergency Department (HOSPITAL_BASED_OUTPATIENT_CLINIC_OR_DEPARTMENT_OTHER)
Admission: EM | Admit: 2019-05-13 | Discharge: 2019-05-13 | Disposition: A | Discharge status: Home / Self Care | Payer: 59 | Attending: Emergency Medicine | Admitting: Emergency Medicine

## 2019-05-13 DIAGNOSIS — R0789 Other chest pain: Secondary | ICD-10-CM | POA: Diagnosis not present

## 2019-05-13 DIAGNOSIS — R0602 Shortness of breath: Secondary | ICD-10-CM | POA: Diagnosis present

## 2019-05-13 DIAGNOSIS — Z79899 Other long term (current) drug therapy: Secondary | ICD-10-CM | POA: Diagnosis not present

## 2019-05-13 DIAGNOSIS — R072 Precordial pain: Secondary | ICD-10-CM | POA: Diagnosis not present

## 2019-05-13 LAB — CBC WITH DIFFERENTIAL/PLATELET
Abs Immature Granulocytes: 0.02 10*3/uL (ref 0.00–0.07)
Basophils Absolute: 0 10*3/uL (ref 0.0–0.1)
Basophils Relative: 1 %
Eosinophils Absolute: 0.2 10*3/uL (ref 0.0–0.5)
Eosinophils Relative: 3 %
HCT: 36.7 % (ref 36.0–46.0)
Hemoglobin: 11.4 g/dL — ABNORMAL LOW (ref 12.0–15.0)
Immature Granulocytes: 0 %
Lymphocytes Relative: 24 %
Lymphs Abs: 1.6 10*3/uL (ref 0.7–4.0)
MCH: 26.8 pg (ref 26.0–34.0)
MCHC: 31.1 g/dL (ref 30.0–36.0)
MCV: 86.4 fL (ref 80.0–100.0)
Monocytes Absolute: 0.8 10*3/uL (ref 0.1–1.0)
Monocytes Relative: 13 %
Neutro Abs: 3.8 10*3/uL (ref 1.7–7.7)
Neutrophils Relative %: 59 %
Platelets: 282 10*3/uL (ref 150–400)
RBC: 4.25 MIL/uL (ref 3.87–5.11)
RDW: 14 % (ref 11.5–15.5)
WBC: 6.4 10*3/uL (ref 4.0–10.5)
nRBC: 0 % (ref 0.0–0.2)

## 2019-05-13 LAB — BASIC METABOLIC PANEL
Anion gap: 4 — ABNORMAL LOW (ref 5–15)
BUN: 14 mg/dL (ref 6–20)
CO2: 24 mmol/L (ref 22–32)
Calcium: 8.6 mg/dL — ABNORMAL LOW (ref 8.9–10.3)
Chloride: 110 mmol/L (ref 98–111)
Creatinine, Ser: 0.71 mg/dL (ref 0.44–1.00)
GFR calc Af Amer: >60 mL/min (ref >60)
GFR calc non Af Amer: >60 mL/min (ref >60)
Glucose, Bld: 95 mg/dL (ref 70–99)
Potassium: 3.9 mmol/L (ref 3.5–5.1)
Sodium: 138 mmol/L (ref 135–145)

## 2019-05-13 LAB — D-DIMER, QUANTITATIVE: D-Dimer, Quant: 0.42 ug/mL-FEU (ref 0.00–0.50)

## 2019-05-13 MED ORDER — KETOROLAC TROMETHAMINE 15 MG/ML IJ SOLN
15.0000 mg | Freq: Once | INTRAMUSCULAR | Status: AC
  Administered 2019-05-13: 15 mg via INTRAVENOUS
  Filled 2019-05-13: qty 1

## 2019-05-13 MED ORDER — NAPROXEN 375 MG PO TABS: 375.0000 mg | ORAL_TABLET | Freq: Two times a day (BID) | ORAL | 0 refills | Status: AC

## 2019-05-13 MED FILL — NAPROXEN 375 MG TABLET: 375 | 5 days supply | Qty: 10 | Fill #0

## 2019-05-13 NOTE — ED Provider Notes (Signed)
[facility] Provider Note   CSN: [id number] Arrival date & time: 05/13/19  1220     History   Chief Complaint Chief Complaint  Patient presents with  . Shortness of Breath  . Chest Pain    HPI Lori Perry is a 23 y.o. female.     HPI   23 year old female with chest pain.  Pain is in the mid to upper sternal area.  First noticed it on Friday.  The pain has been persistent since then.  She has not noticed any appreciable exacerbating relieving factors.  She was seen in urgent care on Saturday.  She complains of abdominal pain at that time as well.  She had imaging which was significant for what is felt to be a significant stool burden.  She was placed on Colace.  She subsequently had a bowel movement without improvement of her symptoms.  Mild shortness of breath.  No cough.  No change in her symptoms with position or deep breathing.  No fevers or chills.  No unusual leg pain or swelling.  She uses a transdermal birth control.  No prior history of DVT/PE.  Past Medical History:  Diagnosis Date  . Anemia   . Normal labor 10/14/2014    Patient Active Problem List   Diagnosis Date Noted  . Vaginal delivery 10/15/2014  . 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 ADD (attention deficit disorder) 10/14/2014  . 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 Anemia 10/14/2014    History reviewed. No pertinent surgical history.   OB History    Gravida  1   Para  1   Term  1   Preterm      AB      Living  1     SAB      TAB      Ectopic      Multiple  0   Live Births  1            Home Medications    Prior to Admission medications   Medication Sig Start Date End Date Taking? Authorizing Provider  Cetirizine HCl 10 MG CAPS Take 1 capsule (10 mg total) by mouth daily for 10 days. 05/08/18 05/18/18  Wieters, Hallie C, PA-C  docusate sodium (COLACE) 100 MG capsule Take 1 capsule (100 mg total) by mouth every 12 (twelve)  hours. 05/11/19   Zigmund Gottron, NP  omeprazole (PRILOSEC) 20 MG capsule Take 1 capsule (20 mg total) by mouth daily. 05/11/19   Zigmund Gottron, NP  [name] 150-35 MCG/24HR transdermal patch PLACE 1 PATCH ONTO THE SKIN ONCE A WEEK. USE FOR 3 WEEKS, THEN 1 WEEK OFF. PLEASE COME IN FOR A VISIT 01/22/19   Copland, Gay Filler, MD    Family History Family History  Problem Relation Age of Onset  . Hypertension Mother     Social History Social History   Tobacco Use  . Smoking status: Never Smoker  . Smokeless tobacco: Never Used  Substance Use Topics  . Alcohol use: No    Alcohol/week: 0.0 standard drinks  . Drug use: No     Allergies   Nitrofurantoin macrocrystal, Amoxicillin, and Sulfa antibiotics   Review of Systems Review of Systems  All systems reviewed and negative, other than as noted in HPI.  Physical Exam Updated Vital Signs BP 119/70   Pulse 91   Temp 98.4 F (36.9 C) (Oral)   Resp 18   Ht 5\' 6"  (1.676 m)  Wt 59 kg   LMP 04/28/2019   SpO2 100%   BMI 20.98 kg/m   Physical Exam Vitals signs and nursing note reviewed.  Constitutional:      General: She is not in acute distress.    Appearance: She is well-developed.  HENT:     Head: Normocephalic and atraumatic.  Eyes:     General:        Right eye: No discharge.        Left eye: No discharge.     Conjunctiva/sclera: Conjunctivae normal.  Neck:     Musculoskeletal: Neck supple.  Cardiovascular:     Rate and Rhythm: Normal rate and regular rhythm.     Heart sounds: Normal heart sounds. No murmur. No friction rub. No gallop.   Pulmonary:     Effort: Pulmonary effort is normal. No respiratory distress.     Breath sounds: Normal breath sounds.     Comments: Tenderness to palpation over the upper left parasternal border.  No overlying skin changes. Chest:     Chest wall: Tenderness present.  Abdominal:     General: There is no distension.     Palpations: Abdomen is soft.     Tenderness: There is no  abdominal tenderness.  Musculoskeletal:        General: No tenderness.     Comments: Lower extremities symmetric as compared to each other. No calf tenderness. Negative Homan's. No palpable cords.   Skin:    General: Skin is warm and dry.  Neurological:     Mental Status: She is alert.  Psychiatric:        Behavior: Behavior normal.        Thought Content: Thought content normal.      ED Treatments / Results  Labs (all labs ordered are listed, but only abnormal results are displayed) Labs Reviewed  CBC WITH DIFFERENTIAL/PLATELET - Abnormal; Notable for the following components:      Result Value   Hemoglobin 11.4 (*)    All other components within normal limits  BASIC METABOLIC PANEL - Abnormal; Notable for the following components:   Calcium 8.6 (*)    Anion gap 4 (*)    All other components within normal limits  D-DIMER, QUANTITATIVE (NOT AT [facility])  CBC WITH DIFFERENTIAL/PLATELET  PREGNANCY, URINE    EKG EKG Interpretation  Date/Time:  Wednesday May 13 2019 13:02:18 EST Ventricular Rate:  100 PR Interval:    QRS Duration: 90 QT Interval:  337 QTC Calculation: 435 R Axis:   79 Text Interpretation: Sinus tachycardia Right atrial enlargement RSR' in V1 or V2, probably normal variant Confirmed by Raeford Razor [id number]) on 05/13/2019 2:10:48 PM   Radiology No results found.  Procedures Procedures (including critical care time)  Medications Ordered in ED Medications  ketorolac (TORADOL) 15 MG/ML injection 15 mg (has no administration in time range)     Initial Impression / Assessment and Plan / ED Course  I have reviewed the triage vital signs and the nursing notes.  Pertinent labs & imaging results that were available during my care of the patient were reviewed by me and considered in my medical decision making (see chart for details).    23 year old female with chest pain.  I suspect that this may potentially be chest wall pain.  She is tender along  her left parasternal border.  Question costochondritis.  She does endorse some mild shortness of breath.  There is no pleuritic component to the pain though.  She is  afebrile.  Denies coughing.  Oxygen saturations are percent on room air.  She is not tachycardic.  She has no signs or symptoms of DVT.  She does use birth control though.  I really doubt that this is a PE, but will obtain a D-dimer.  Recent imaging was reviewed.  Fairly unremarkable.  I do not think that constant check EKG.  Prior imaging was reviewed.  Fairly unremarkable.I doubt that constipation is the source of her symptoms.  She reports that she had a good bowel movement with no change in which she has been feeling.  She actually denies no abdominal pain to me today.  Abdominal exam is benign. I do not think additional imaging would be of much utility at this point unless d-dimer is elevated.   Fairly unremarkable.  Negative D-dimer effectively rules out a PE.  Still suspect this is chest wall pain.  As needed NSAIDs for several more days.  Return precautions discussed.  Outpatient follow-up otherwise. Final Clinical Impressions(s) / ED Diagnoses     Final diagnoses:  Chest wall pain    ED Discharge Orders    None       Raeford RazorKohut, Tahjir Silveria, MD 05/13/19 1502

## 2019-05-13 NOTE — Telephone Encounter (Signed)
Called pt- no answer LMOM.  We can possibly see her today, but if having severe abd pain it might be more efficient to visit the ER as she will need labs and a CT Message to pt via mychart as well

## 2019-05-13 NOTE — ED Notes (Signed)
ED Provider, Dr. Wilson Singer, at bedside discussing test results and dispo plan of care.

## 2019-05-13 NOTE — ED Triage Notes (Signed)
Pt reports cont chest pain ans shortness of breath, was seen on Saturday for abd pain and chest pain, was dx with stool impaction, medicated , had a bowel movement , yet no relief from abd pain. sts co worsening in chest pain and shortness of breath . denies anxiety. denies cough

## 2019-05-14 ENCOUNTER — Encounter: Payer: Self-pay | Admitting: Family Medicine

## 2019-05-14 ENCOUNTER — Other Ambulatory Visit: Payer: Self-pay | Admitting: Family Medicine

## 2019-05-17 ENCOUNTER — Encounter: Payer: Self-pay | Admitting: Family Medicine

## 2019-05-18 ENCOUNTER — Encounter: Payer: Self-pay | Admitting: Family Medicine

## 2019-05-18 ENCOUNTER — Other Ambulatory Visit: Payer: Self-pay

## 2019-05-18 ENCOUNTER — Ambulatory Visit: Payer: 59 | Admitting: Family Medicine

## 2019-05-18 VITALS — BP 126/80 | HR 90 | Temp 98.3°F | Resp 16 | Ht 66.0 in | Wt 128.0 lb

## 2019-05-18 DIAGNOSIS — R0789 Other chest pain: Secondary | ICD-10-CM | POA: Diagnosis not present

## 2019-05-18 LAB — POCT URINE PREGNANCY: Preg Test, Ur: NEGATIVE

## 2019-05-18 MED ORDER — PREDNISONE 20 MG PO TABS: ORAL_TABLET | ORAL | 0 refills | Status: DC

## 2019-05-18 MED FILL — predniSONE 20 MG TABS: 20 | 6 days supply | Qty: 9 | Fill #0

## 2019-05-18 NOTE — Progress Notes (Signed)
[facility] [address], [place], [place] [address] [phone]  Date:  05/18/2019   Name:  Lori Perry   DOB:  [birth year]   MRN:  [id number]  PCP:  Darreld Mclean, MD    Chief Complaint: Chest Pain (follow up, seen at ER)   History of Present Illness:  Lori Perry is a 23 y.o. very pleasant female patient who presents with the following:  Young woman with history of ADD, following up today from recent ER visit for chest pain  Her sx started on the 4th of this month  Seen at [facility] on 12/7 She was seen in the emergency room on 12/9 with concern of chest pain, determined to be most likely musculoskeletal D-dimer was negative-no personal or family history of clot in the past.  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  However she is not ware of anything different at her job that could have triggered this pain No nausea or vomiting No urinary sx   DG Abd Acute W/Chest  Result Date: 05/11/2019 CLINICAL DATA:  Left upper quadrant abdominal and chest pain for 3 days. EXAM: DG ABDOMEN ACUTE W/ 1V CHEST COMPARISON:  None. FINDINGS: The heart size and mediastinal contours are normal. The lungs are clear. There is no pleural effusion or pneumothorax. No acute osseous findings are identified. The bowel gas pattern is normal. There is no free intraperitoneal air or suspicious abdominal calcification. Mildly prominent stool noted in the right colon and rectum. IMPRESSION: No active cardiopulmonary or abdominal process. Mildly prominent stool in the right colon and rectum. Electronically Signed   By: Richardean Sale M.D.   On: 05/11/2019 11:48      Patient Active Problem List   Diagnosis  Date Noted  . Vaginal delivery 10/15/2014  . 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 ADD (attention deficit disorder) 10/14/2014  . 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 Anemia 10/14/2014    Past Medical History:  Diagnosis Date  . Anemia   . Normal labor 10/14/2014    No past surgical history on file.  Social History   Tobacco Use  . Smoking status: Never Smoker  . Smokeless tobacco: Never Used  Substance Use Topics  . Alcohol use: No    Alcohol/week: 0.0 standard drinks  . Drug use: No    Family History  Problem Relation Age of Onset  . Hypertension Mother     Allergies  Allergen Reactions  . Nitrofurantoin Macrocrystal   . Amoxicillin Rash  . Sulfa Antibiotics Rash    Medication list has been reviewed and updated.  Current Outpatient Medications on File Prior to Visit  Medication Sig Dispense Refill  . docusate sodium (COLACE) 100 MG capsule Take 1 capsule (100 mg total) by mouth every 12 (twelve) hours. 60 capsule 0  . naproxen (NAPROSYN) 375 MG tablet Take 1 tablet (375 mg total) by mouth 2 (two) times daily with a meal. 10 tablet 0  . omeprazole (PRILOSEC) 20 MG capsule Take 1 capsule (20 mg total) by mouth daily. 30 capsule 0  . XULANE 150-35 MCG/24HR transdermal patch PLACE 1 PATCH ONTO  THE SKIN ONCE A WEEK. USE FOR 3 WEEKS, THEN 1 WEEK OFF. PLEASE COME IN FOR A VISIT 12 patch 5   No current facility-administered medications on file prior to visit.    Review of Systems:  As per HPI- otherwise negative.  Pulse Readings from Last 3 Encounters:  05/18/19 90  05/13/19 89  05/11/19 100    No fever or chills   Physical Examination: Vitals:   05/18/19 1316 05/18/19 1335  BP: 126/80   Pulse: (!) 113 90  Resp: 16   Temp: 98.3 F (36.8 C)   SpO2: 98%    Vitals:   05/18/19 1316  Weight: 128 lb (58.1 kg)  Height: 5\' 6"  (1.676 m)   Body mass index is 20.66 kg/m. Ideal Body Weight: Weight in (lb) to have BMI = 25: 154.6  GEN: WDWN,  NAD, Non-toxic, A & O x 3, slim build, looks well HEENT: Atraumatic, Normocephalic. Neck supple. No masses, No LAD. Ears and Nose: No external deformity. CV: RRR, No M/G/R. No JVD. No thrill. No extra heart sounds. PULM: CTA B, no wheezes, crackles, rhonchi. No retractions. No resp. distress. No accessory muscle use. ABD: S, NT, ND, +BS. No rebound. No HSM. EXTR: No c/c/e NEURO Normal gait.  PSYCH: Normally interactive. Conversant. Not depressed or anxious appearing.  Calm demeanor.  Unable to reproduce her pain by pressing on her left lateral ribs, and at the right sternal border No rash or skin redness is noted Belly is benign   Assessment and Plan: Chest wall pain - Plan: predniSONE (DELTASONE) 20 MG tablet, POCT urine pregnancy  Here today with likely chest wall pain.  Patient has been evaluated at urgent care and at the emergency department, she had a plain film of her chest and a D-dimer which were reassuring, EKG also okay  She has tried NSAIDs without much success  We will give her a course of prednisone for inflammation, advised her to avoid NSAIDs while on this medication, Tylenol is okay  If not doing significantly better in about 48 hours, consider doing a CT of her chest-she will update me via MyChart, sooner if worse  Obtained a urine pregnancy test today in case she does need a CT scan  Results for orders placed or performed in visit on 05/18/19  POCT urine pregnancy  Result Value Ref Range   Preg Test, Ur Negative Negative    This visit occurred during the SARS-CoV-2 public health emergency.  Safety protocols were in place, including screening questions prior to the visit, additional usage of staff PPE, and extensive cleaning of exam room while observing appropriate contact time as indicated for disinfecting solutions.    Signed 05/20/19, MD

## 2019-05-18 NOTE — Patient Instructions (Signed)
It was good to see you again today Let's have you try prednisone for presumed musculo-skeletal pain in your chest However, if you are not significantly better in about 2 days we will plan to do a CT scan for you.  Please keep me posted While on the prednisone use tylenol as needed for pain but avoid NSAIDS

## 2019-05-20 ENCOUNTER — Encounter: Payer: Self-pay | Admitting: Family Medicine

## 2019-05-20 ENCOUNTER — Other Ambulatory Visit: Payer: Self-pay | Admitting: Family Medicine

## 2019-05-20 DIAGNOSIS — R0789 Other chest pain: Secondary | ICD-10-CM

## 2019-05-21 ENCOUNTER — Ambulatory Visit (HOSPITAL_BASED_OUTPATIENT_CLINIC_OR_DEPARTMENT_OTHER)
Admission: RE | Admit: 2019-05-21 | Discharge: 2019-05-21 | Disposition: A | Discharge status: Home / Self Care | Payer: 59 | Source: Ambulatory Visit | Attending: Family Medicine | Admitting: Family Medicine

## 2019-05-21 ENCOUNTER — Encounter (HOSPITAL_BASED_OUTPATIENT_CLINIC_OR_DEPARTMENT_OTHER): Payer: Self-pay

## 2019-05-21 ENCOUNTER — Encounter: Payer: Self-pay | Admitting: Family Medicine

## 2019-05-21 ENCOUNTER — Other Ambulatory Visit: Payer: Self-pay

## 2019-05-21 DIAGNOSIS — R0602 Shortness of breath: Secondary | ICD-10-CM | POA: Diagnosis not present

## 2019-05-21 DIAGNOSIS — R0789 Other chest pain: Secondary | ICD-10-CM | POA: Diagnosis not present

## 2019-05-21 MED ORDER — IOHEXOL 350 MG/ML SOLN
100.0000 mL | Freq: Once | INTRAVENOUS | Status: AC | PRN
  Administered 2019-05-21: 15:00:00 100 mL via INTRAVENOUS

## 2019-05-22 ENCOUNTER — Encounter: Payer: Self-pay | Admitting: Family Medicine

## 2019-05-22 MED ORDER — MELOXICAM 15 MG PO TABS: 15.0000 mg | ORAL_TABLET | Freq: Every day | ORAL | 0 refills | Status: DC

## 2019-05-22 MED ORDER — METHOCARBAMOL 500 MG PO TABS: 500.0000 mg | ORAL_TABLET | Freq: Three times a day (TID) | ORAL | 0 refills | Status: DC | PRN

## 2019-05-25 MED FILL — METHOCARBAMOL 500 MG TABLET: 500 | 10 days supply | Qty: 30 | Fill #0

## 2019-05-25 MED FILL — MELOXICAM 15 MG TABLET: 15 | 30 days supply | Qty: 30 | Fill #0

## 2019-06-12 MED FILL — CLINDAMYCIN HCL 150 MG CAPS: 150 | 7 days supply | Qty: 28 | Fill #0

## 2019-06-16 MED FILL — CHLORHEXIDINE 0.12% RINSE: 0.12 | 16 days supply | Qty: 473 | Fill #0

## 2019-06-16 MED FILL — DOXYCYCLINE HYCLATE 100 MG: 100 | 7 days supply | Qty: 14 | Fill #0

## 2019-06-16 MED FILL — HYDROCODON-APAP 5-325: 5-325 | 5 days supply | Qty: 20 | Fill #0

## 2019-06-17 ENCOUNTER — Encounter: Payer: 59 | Admitting: Family Medicine

## 2019-06-28 NOTE — Progress Notes (Addendum)
[facility] [address], [address] [place], [place] [address] [phone]  Date:  07/02/2019   Name:  Lori Perry   DOB:  08-Apr-1996   MRN:  [id number]  PCP:  Pearline Cables, MD    Chief Complaint: Annual Exam   History of Present Illness:  Lori Perry is a 24 y.o. very pleasant female patient who presents with the following:  Generally healthy young woman who was recently struggled with some chest wall pain Also history of ADD Here today for a routine physical I saw her most recently in December, at that time she had been seen in the ER and had some work-up for chest pain We did end up doing a CT angiogram to rule out PE on December 17 which was normal Her chest pain finally went away-no longer bothersome She got her wisdom teeth out 2 weeks ago; she had all 4 wisdom teeth and one additional molar pulled-she is currently taking antibiotics for same  Pap appears to be due-patient reports that this is done by OBG Flu shot done Tetanus up-to-date CBC, CMet done in December  She has a 32-year-old son, he is in good health Works at [employer] as a Nature conservation officer She had a possible suicide attempt in winter 2018  Currently Lori Perry states that her mood is good, she has no current concerns about depression  She asks about her immunizations, I do not have her complete records available.  I advised her that she may need to complete the Gardasil series if not done already  Patient Active Problem List   Diagnosis Date Noted  . Vaginal delivery 10/15/2014  . 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 ADD (attention deficit disorder) 10/14/2014  . 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 Anemia 10/14/2014    Past Medical History:  Diagnosis Date  . Anemia   . Normal labor 10/14/2014    History reviewed. No pertinent surgical history.  Social History   Tobacco Use  . Smoking status: Never Smoker  .  Smokeless tobacco: Never Used  Substance Use Topics  . Alcohol use: No    Alcohol/week: 0.0 standard drinks  . Drug use: No    Family History  Problem Relation Age of Onset  . Hypertension Mother     Allergies  Allergen Reactions  . Nitrofurantoin Macrocrystal   . Amoxicillin Rash  . Sulfa Antibiotics Rash    Medication list has been reviewed and updated.  Current Outpatient Medications on File Prior to Visit  Medication Sig Dispense Refill  . docusate sodium (COLACE) 100 MG capsule Take 1 capsule (100 mg total) by mouth every 12 (twelve) hours. 60 capsule 0  . meloxicam (MOBIC) 15 MG tablet Take 1 tablet (15 mg total) by mouth daily. Use as needed for chest pain 30 tablet 0  . methocarbamol (ROBAXIN) 500 MG tablet Take 1 tablet (500 mg total) by mouth every 8 (eight) hours as needed for muscle spasms. 30 tablet 0  . naproxen (NAPROSYN) 375 MG tablet Take 1 tablet (375 mg total) by mouth 2 (two) times daily with a meal. 10 tablet 0  . omeprazole (PRILOSEC) 20 MG capsule Take 1 capsule (20 mg total) by mouth daily. 30 capsule 0  . predniSONE (DELTASONE) 20 MG tablet Take 2 pills daily for 3 days, then 1 pill daily for 3 days 9 tablet 0  . XULANE 150-35 MCG/24HR transdermal patch PLACE 1 PATCH ONTO THE  SKIN ONCE A WEEK. USE FOR 3 WEEKS, THEN 1 WEEK OFF. PLEASE COME IN FOR A VISIT 12 patch 5   No current facility-administered medications on file prior to visit.    Review of Systems:  As per HPI- otherwise negative.   Physical Examination: Vitals:   07/02/19 1404  BP: 102/70  Pulse: 84  Resp: 16  Temp: 97.7 F (36.5 C)  SpO2: 98%   Vitals:   07/02/19 1404  Weight: 124 lb (56.2 kg)  Height: 5\' 6"  (1.676 m)   Body mass index is 20.01 kg/m. Ideal Body Weight: Weight in (lb) to have BMI = 25: 154.6  GEN: WDWN, NAD, Non-toxic, A & O x 3, slim build, looks well HEENT: Atraumatic, Normocephalic. Neck supple. No masses, No LAD. Ears and Nose: No external  deformity. CV: RRR, No M/G/R. No JVD. No thrill. No extra heart sounds. PULM: CTA B, no wheezes, crackles, rhonchi. No retractions. No resp. distress. No accessory muscle use. ABD: S, NT, ND, +BS. No rebound. No HSM. EXTR: No c/c/e NEURO Normal gait.  PSYCH: Normally interactive. Conversant. Not depressed or anxious appearing.  Calm demeanor.    Assessment and Plan: Physical exam  Screening for deficiency anemia - Plan: CBC, Ferritin  Screening for hyperlipidemia - Plan: Lipid panel  Screening for thyroid disorder - Plan: TSH  Screening for diabetes mellitus - Plan: Comprehensive metabolic panel, Hemoglobin A1c  Here today for physical exam.  Generally in good health, no particular concerns today Calcium was slightly low at last lab check, repeat today Patient reports history of iron deficiency, will check this for today as well  Will plan further follow- up pending labs.  This visit occurred during the SARS-CoV-2 public health emergency.  Safety protocols were in place, including screening questions prior to the visit, additional usage of staff PPE, and extensive cleaning of exam room while observing appropriate contact time as indicated for disinfecting solutions.    Signed Lamar Blinks, MD  Received her labs 1/29, message to patient   Results for orders placed or performed in visit on 07/02/19  CBC  Result Value Ref Range   WBC 6.5 4.0 - 10.5 K/uL   RBC 4.81 3.87 - 5.11 Mil/uL   Platelets 315.0 150.0 - 400.0 K/uL   Hemoglobin 13.0 12.0 - 15.0 g/dL   HCT 40.6 36.0 - 46.0 %   MCV 84.6 78.0 - 100.0 fl   MCHC 32.0 30.0 - 36.0 g/dL   RDW 15.6 (H) 11.5 - 15.5 %  Comprehensive metabolic panel  Result Value Ref Range   Sodium 139 135 - 145 mEq/L   Potassium 4.3 3.5 - 5.1 mEq/L   Chloride 103 96 - 112 mEq/L   CO2 30 19 - 32 mEq/L   Glucose, Bld 68 (L) 70 - 99 mg/dL   BUN 13 6 - 23 mg/dL   Creatinine, Ser 0.73 0.40 - 1.20 mg/dL   Total Bilirubin 0.3 0.2 - 1.2 mg/dL    Alkaline Phosphatase 63 39 - 117 U/L   AST 14 0 - 37 U/L   ALT 12 0 - 35 U/L   Total Protein 7.2 6.0 - 8.3 g/dL   Albumin 4.5 3.5 - 5.2 g/dL   GFR 97.08 >60.00 mL/min   Calcium 9.3 8.4 - 10.5 mg/dL  Hemoglobin A1c  Result Value Ref Range   Hgb A1c MFr Bld 5.4 4.6 - 6.5 %  Lipid panel  Result Value Ref Range   Cholesterol 171 0 - 200 mg/dL  Triglycerides 45.0 0.0 - 149.0 mg/dL   HDL 78.93 >81.01 mg/dL   VLDL 9.0 0.0 - 75.1 mg/dL   LDL Cholesterol 025 (H) 0 - 99 mg/dL   Total CHOL/HDL Ratio 3    NonHDL 118.45   TSH  Result Value Ref Range   TSH 0.61 0.35 - 4.50 uIU/mL  Ferritin  Result Value Ref Range   Ferritin 4.8 (L) 10.0 - 291.0 ng/mL

## 2019-07-02 ENCOUNTER — Encounter: Payer: Self-pay | Admitting: Family Medicine

## 2019-07-02 ENCOUNTER — Ambulatory Visit (INDEPENDENT_AMBULATORY_CARE_PROVIDER_SITE_OTHER): Payer: 59 | Admitting: Family Medicine

## 2019-07-02 ENCOUNTER — Other Ambulatory Visit: Payer: Self-pay

## 2019-07-02 VITALS — BP 102/70 | HR 84 | Temp 97.7°F | Resp 16 | Ht 66.0 in | Wt 124.0 lb

## 2019-07-02 DIAGNOSIS — Z131 Encounter for screening for diabetes mellitus: Secondary | ICD-10-CM | POA: Diagnosis not present

## 2019-07-02 DIAGNOSIS — Z0000 Encounter for general adult medical examination without abnormal findings: Secondary | ICD-10-CM

## 2019-07-02 DIAGNOSIS — Z130 Encounter for screening for diseases of the blood and blood-forming organs and certain disorders involving the immune mechanism: Secondary | ICD-10-CM | POA: Diagnosis not present

## 2019-07-02 DIAGNOSIS — Z13220 Encounter for screening for lipoid disorders: Secondary | ICD-10-CM

## 2019-07-02 DIAGNOSIS — Z1329 Encounter for screening for other suspected endocrine disorder: Secondary | ICD-10-CM

## 2019-07-02 NOTE — Patient Instructions (Signed)
It was good to see you again today- I will be in touch with your labs asap You should have refills of your birth control patch until the end of summer- let me know when you need more    Health Maintenance, Female Adopting a healthy lifestyle and getting preventive care are important in promoting health and wellness. Ask your health care provider about:  The right schedule for you to have regular tests and exams.  Things you can do on your own to prevent diseases and keep yourself healthy. What should I know about diet, weight, and exercise? Eat a healthy diet   Eat a diet that includes plenty of vegetables, fruits, low-fat dairy products, and lean protein.  Do not eat a lot of foods that are high in solid fats, added sugars, or sodium. Maintain a healthy weight Body mass index (BMI) is used to identify weight problems. It estimates body fat based on height and weight. Your health care provider can help determine your BMI and help you achieve or maintain a healthy weight. Get regular exercise Get regular exercise. This is one of the most important things you can do for your health. Most adults should:  Exercise for at least 150 minutes each week. The exercise should increase your heart rate and make you sweat (moderate-intensity exercise).  Do strengthening exercises at least twice a week. This is in addition to the moderate-intensity exercise.  Spend less time sitting. Even light physical activity can be beneficial. Watch cholesterol and blood lipids Have your blood tested for lipids and cholesterol at 25 years of age, then have this test every 5 years. Have your cholesterol levels checked more often if:  Your lipid or cholesterol levels are high.  You are older than 25 years of age.  You are at high risk for heart disease. What should I know about cancer screening? Depending on your health history and family history, you may need to have cancer screening at various ages. This  may include screening for:  Breast cancer.  Cervical cancer.  Colorectal cancer.  Skin cancer.  Lung cancer. What should I know about heart disease, diabetes, and high blood pressure? Blood pressure and heart disease  High blood pressure causes heart disease and increases the risk of stroke. This is more likely to develop in people who have high blood pressure readings, are of African descent, or are overweight.  Have your blood pressure checked: ? Every 3-5 years if you are 63-14 years of age. ? Every year if you are 57 years old or older. Diabetes Have regular diabetes screenings. This checks your fasting blood sugar level. Have the screening done:  Once every three years after age 63 if you are at a normal weight and have a low risk for diabetes.  More often and at a younger age if you are overweight or have a high risk for diabetes. What should I know about preventing infection? Hepatitis B If you have a higher risk for hepatitis B, you should be screened for this virus. Talk with your health care provider to find out if you are at risk for hepatitis B infection. Hepatitis C Testing is recommended for:  Everyone born from [birth year] through 1966.  Anyone with known risk factors for hepatitis C. Sexually transmitted infections (STIs)  Get screened for STIs, including gonorrhea and chlamydia, if: ? You are sexually active and are younger than 25 years of age. ? You are older than 25 years of age and your health  care provider tells you that you are at risk for this type of infection. ? Your sexual activity has changed since you were last screened, and you are at increased risk for chlamydia or gonorrhea. Ask your health care provider if you are at risk.  Ask your health care provider about whether you are at high risk for HIV. Your health care provider may recommend a prescription medicine to help prevent HIV infection. If you choose to take medicine to prevent HIV, you should  first get tested for HIV. You should then be tested every 3 months for as long as you are taking the medicine. Pregnancy  If you are about to stop having your period (premenopausal) and you may become pregnant, seek counseling before you get pregnant.  Take 400 to 800 micrograms (mcg) of folic acid every day if you become pregnant.  Ask for birth control (contraception) if you want to prevent pregnancy. Osteoporosis and menopause Osteoporosis is a disease in which the bones lose minerals and strength with aging. This can result in bone fractures. If you are 54 years old or older, or if you are at risk for osteoporosis and fractures, ask your health care provider if you should:  Be screened for bone loss.  Take a calcium or vitamin D supplement to lower your risk of fractures.  Be given hormone replacement therapy (HRT) to treat symptoms of menopause. Follow these instructions at home: Lifestyle  Do not use any products that contain nicotine or tobacco, such as cigarettes, e-cigarettes, and chewing tobacco. If you need help quitting, ask your health care provider.  Do not use street drugs.  Do not share needles.  Ask your health care provider for help if you need support or information about quitting drugs. Alcohol use  Do not drink alcohol if: ? Your health care provider tells you not to drink. ? You are pregnant, may be pregnant, or are planning to become pregnant.  If you drink alcohol: ? Limit how much you use to 0-1 drink a day. ? Limit intake if you are breastfeeding.  Be aware of how much alcohol is in your drink. In the [place] one drink equals one 12 oz bottle of beer (355 mL), one 5 oz glass of wine (148 mL), or one 1 oz glass of hard liquor (44 mL). General instructions  Schedule regular health, dental, and eye exams.  Stay current with your vaccines.  Tell your health care provider if: ? You often feel depressed. ? You have ever been abused or do not feel safe  at home. Summary  Adopting a healthy lifestyle and getting preventive care are important in promoting health and wellness.  Follow your health care provider's instructions about healthy diet, exercising, and getting tested or screened for diseases.  Follow your health care provider's instructions on monitoring your cholesterol and blood pressure. This information is not intended to replace advice given to you by your health care provider. Make sure you discuss any questions you have with your health care provider. Document Revised: 05/14/2018 Document Reviewed: 05/14/2018 Elsevier Patient Education  2020 [employer].

## 2019-07-03 ENCOUNTER — Encounter: Payer: Self-pay | Admitting: Family Medicine

## 2019-07-03 LAB — LIPID PANEL
Cholesterol: 171 mg/dL (ref 0–200)
HDL: 52.6 mg/dL (ref >39.00)
LDL Cholesterol: 109 mg/dL — ABNORMAL HIGH (ref 0–99)
NonHDL: 118.45
Total CHOL/HDL Ratio: 3
Triglycerides: 45 mg/dL (ref 0.0–149.0)
VLDL: 9 mg/dL (ref 0.0–40.0)

## 2019-07-03 LAB — CBC
HCT: 40.6 % (ref 36.0–46.0)
Hemoglobin: 13 g/dL (ref 12.0–15.0)
MCHC: 32 g/dL (ref 30.0–36.0)
MCV: 84.6 fl (ref 78.0–100.0)
Platelets: 315 10*3/uL (ref 150.0–400.0)
RBC: 4.81 Mil/uL (ref 3.87–5.11)
RDW: 15.6 % — ABNORMAL HIGH (ref 11.5–15.5)
WBC: 6.5 10*3/uL (ref 4.0–10.5)

## 2019-07-03 LAB — COMPREHENSIVE METABOLIC PANEL
ALT: 12 U/L (ref 0–35)
AST: 14 U/L (ref 0–37)
Albumin: 4.5 g/dL (ref 3.5–5.2)
Alkaline Phosphatase: 63 U/L (ref 39–117)
BUN: 13 mg/dL (ref 6–23)
CO2: 30 mEq/L (ref 19–32)
Calcium: 9.3 mg/dL (ref 8.4–10.5)
Chloride: 103 mEq/L (ref 96–112)
Creatinine, Ser: 0.73 mg/dL (ref 0.40–1.20)
GFR: 97.08 mL/min (ref >60.00)
Glucose, Bld: 68 mg/dL — ABNORMAL LOW (ref 70–99)
Potassium: 4.3 mEq/L (ref 3.5–5.1)
Sodium: 139 mEq/L (ref 135–145)
Total Bilirubin: 0.3 mg/dL (ref 0.2–1.2)
Total Protein: 7.2 g/dL (ref 6.0–8.3)

## 2019-07-03 LAB — HEMOGLOBIN A1C: Hgb A1c MFr Bld: 5.4 % (ref 4.6–6.5)

## 2019-07-03 LAB — TSH: TSH: 0.61 u[IU]/mL (ref 0.35–4.50)

## 2019-07-03 LAB — FERRITIN: Ferritin: 4.8 ng/mL — ABNORMAL LOW (ref 10.0–291.0)

## 2019-07-06 ENCOUNTER — Encounter: Payer: Self-pay | Admitting: Family Medicine

## 2019-07-07 ENCOUNTER — Encounter (HOSPITAL_COMMUNITY): Payer: Self-pay | Admitting: Emergency Medicine

## 2019-07-07 ENCOUNTER — Ambulatory Visit (HOSPITAL_COMMUNITY)
Admission: EM | Admit: 2019-07-07 | Discharge: 2019-07-07 | Disposition: A | Discharge status: Home / Self Care | Payer: 59 | Attending: Family Medicine | Admitting: Family Medicine

## 2019-07-07 ENCOUNTER — Other Ambulatory Visit: Payer: Self-pay

## 2019-07-07 DIAGNOSIS — G44209 Tension-type headache, unspecified, not intractable: Secondary | ICD-10-CM | POA: Diagnosis not present

## 2019-07-07 DIAGNOSIS — Z20822 Contact with and (suspected) exposure to covid-19: Secondary | ICD-10-CM | POA: Diagnosis not present

## 2019-07-07 MED ORDER — IBUPROFEN 800 MG PO TABS: 800.0000 mg | ORAL_TABLET | Freq: Three times a day (TID) | ORAL | 0 refills | Status: AC

## 2019-07-07 MED FILL — IBUPROFEN 800 MG TAB: 800 | 7 days supply | Qty: 21 | Fill #0

## 2019-07-07 NOTE — ED Notes (Signed)
Nasal swab in lab 

## 2019-07-07 NOTE — ED Triage Notes (Signed)
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

## 2019-07-07 NOTE — Discharge Instructions (Signed)
Covid test pending, based off your exposure recommend quarantining for full 10 days regardless of your result  Use anti-inflammatories for headache or any fevers. You may take up to 800 mg Ibuprofen every 8 hours with food. You may supplement Ibuprofen with Tylenol [phone] mg every 8 hours.   Please rest and drink plenty of fluids  If you develop more congestion/cough May try over-the-counter Mucinex, Sudafed, Flonase, Delsym, Robitussin  Please follow-up if any of your symptoms are not improving or worsening, developing difficulty breathing, shortness of breath, chest pain

## 2019-07-07 NOTE — ED Provider Notes (Signed)
[facility]    CSN: [id number] Arrival date & time: 07/07/19  0906      History   Chief Complaint Chief Complaint  Patient presents with  . Headache    HPI Lori Perry is a 24 y.o. female presenting today for evaluation of headache and Covid exposure.  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  She notes that her boyfriend began to have symptoms approximately 4 days ago when she has been around him throughout the past week.  She began to develop a headache last night at work.  Is frontal and patient describes it as a tension headache.  She took 2 naproxen earlier this morning without significant relief.  Denies URI symptoms.  Denies GI symptoms.  Normal appetite and oral intake.  Denies chest pain or shortness of breath.  HPI  Past Medical History:  Diagnosis Date  . Anemia   . Normal labor 10/14/2014    Patient Active Problem List   Diagnosis Date Noted  . Vaginal delivery 10/15/2014  . 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 ADD (attention deficit disorder) 10/14/2014  . 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 Anemia 10/14/2014    History reviewed. No pertinent surgical history.  OB History    Gravida  1   Para  1   Term  1   Preterm      AB      Living  1     SAB      TAB      Ectopic      Multiple  0   Live Births  1            Home Medications    Prior to Admission medications   Medication Sig Start Date End Date Taking? Authorizing Provider  docusate sodium (COLACE) 100 MG capsule Take 1 capsule (100 mg total) by mouth every 12 (twelve) hours. 05/11/19   Georgetta Haber, NP  ibuprofen (ADVIL) 800 MG tablet Take 1 tablet (800 mg total) by mouth 3 (three) times daily. 07/07/19   ,  C, PA-C  naproxen (NAPROSYN) 375 MG tablet Take 1 tablet (375 mg total) by mouth 2 (two) times daily with a meal. 05/13/19   Raeford Razor, MD  [employer] 150-35 MCG/24HR transdermal  patch PLACE 1 PATCH ONTO THE SKIN ONCE A WEEK. USE FOR 3 WEEKS, THEN 1 WEEK OFF. PLEASE COME IN FOR A VISIT 01/22/19   Copland, Gwenlyn Found, MD  omeprazole (PRILOSEC) 20 MG capsule Take 1 capsule (20 mg total) by mouth daily. 05/11/19 07/07/19  Georgetta Haber, NP    Family History Family History  Problem Relation Age of Onset  . Hypertension Mother     Social History Social History   Tobacco Use  . Smoking status: Never Smoker  . Smokeless tobacco: Never Used  Substance Use Topics  . Alcohol use: No    Alcohol/week: 0.0 standard drinks  . Drug use: No     Allergies   Nitrofurantoin macrocrystal, Amoxicillin, and Sulfa antibiotics   Review of Systems Review of Systems  Constitutional: Negative for activity change, appetite change, chills, fatigue and fever.  HENT: Negative for congestion, ear pain, rhinorrhea, sinus pressure, sore throat and trouble swallowing.   Eyes: Negative for discharge and redness.  Respiratory: Negative for cough, chest tightness and shortness of breath.   Cardiovascular: Negative for chest pain.  Gastrointestinal: Negative for abdominal pain, diarrhea, nausea and vomiting.  Musculoskeletal:  Negative for myalgias.  Skin: Negative for rash.  Neurological: Positive for headaches. Negative for dizziness and light-headedness.     Physical Exam Triage Vital Signs ED Triage Vitals  Enc Vitals Group     BP 07/07/19 0940 112/70     Pulse Rate 07/07/19 0940 84     Resp 07/07/19 0940 16     Temp 07/07/19 0940 98.7 F (37.1 C)     Temp Source 07/07/19 0940 Oral     SpO2 07/07/19 0940 100 %     Weight --      Height --      Head Circumference --      Peak Flow --      Pain Score 07/07/19 0935 3     Pain Loc --      Pain Edu? --      Excl. in GC? --    No data found.  Updated Vital Signs BP 112/70 (BP Location: Left Arm)   Pulse 84   Temp 98.7 F (37.1 C) (Oral)   Resp 16   LMP 06/29/2019   SpO2 100%   Visual Acuity Right Eye Distance:    Left Eye Distance:   Bilateral Distance:    Right Eye Near:   Left Eye Near:    Bilateral Near:     Physical Exam Vitals and nursing note reviewed.  Constitutional:      General: She is not in acute distress.    Appearance: She is well-developed.  HENT:     Head: Normocephalic and atraumatic.     Ears:     Comments: Bilateral ears without tenderness to palpation of external auricle, tragus and mastoid, EAC's without erythema or swelling, TM's with good bony landmarks and cone of light. Non erythematous.     Nose:     Comments: Nasal mucosa pink, nonswollen turbinates    Mouth/Throat:     Comments: Oral mucosa pink and moist, no tonsillar enlargement or exudate. Posterior pharynx patent and nonerythematous, no uvula deviation or swelling. Normal phonation.  Eyes:     Conjunctiva/sclera: Conjunctivae normal.  Cardiovascular:     Rate and Rhythm: Normal rate and regular rhythm.     Heart sounds: No murmur.  Pulmonary:     Effort: Pulmonary effort is normal. No respiratory distress.     Breath sounds: Normal breath sounds.     Comments: Breathing comfortably at rest, CTABL, no wheezing, rales or other adventitious sounds auscultated  Musculoskeletal:     Cervical back: Neck supple.  Skin:    General: Skin is warm and dry.  Neurological:     General: No focal deficit present.     Mental Status: She is alert and oriented to person, place, and time. Mental status is at baseline.     Cranial Nerves: No cranial nerve deficit.     Motor: No weakness.     Gait: Gait normal.      UC Treatments / Results  Labs (all labs ordered are listed, but only abnormal results are displayed) Labs Reviewed  NOVEL CORONAVIRUS, NAA (HOSP ORDER, SEND-OUT TO REF LAB; TAT 18-24 HRS)    EKG   Radiology No results found.  Procedures Procedures (including critical care time)  Medications Ordered in UC Medications - No data to display  Initial Impression / Assessment and Plan / UC  Course  I have reviewed the triage vital signs and the nursing notes.  Pertinent labs & imaging results that were available during my care of  the patient were reviewed by me and considered in my medical decision making (see chart for details).     Headache x1 day, no other URI symptoms, but given close exposure to Covid recommended patient to quarantine for 10 days, Covid PCR pending.  Continue symptomatic and supportive care.  Rest and push fluids.  Ibuprofen for headache, no red flags with headache.  Continue to monitor,Discussed strict return precautions. Patient verbalized understanding and is agreeable with plan.  Final Clinical Impressions(s) / UC Diagnoses   Final diagnoses:  Close exposure to COVID-19 virus  Encounter for laboratory testing for COVID-19 virus  Acute non intractable tension-type headache     Discharge Instructions     Covid test pending, based off your exposure recommend quarantining for full 10 days regardless of your result  Use anti-inflammatories for headache or any fevers. You may take up to 800 mg Ibuprofen every 8 hours with food. You may supplement Ibuprofen with Tylenol [phone] mg every 8 hours.   Please rest and drink plenty of fluids  If you develop more congestion/cough May try over-the-counter Mucinex, Sudafed, Flonase, Delsym, Robitussin  Please follow-up if any of your symptoms are not improving or worsening, developing difficulty breathing, shortness of breath, chest pain    ED Prescriptions    Medication Sig Dispense Auth. Provider   ibuprofen (ADVIL) 800 MG tablet Take 1 tablet (800 mg total) by mouth 3 (three) times daily. 21 tablet , Stafford C, PA-C     PDMP not reviewed this encounter.   Janith Lima, PA-C 07/07/19 1007

## 2019-07-09 LAB — NOVEL CORONAVIRUS, NAA (HOSP ORDER, SEND-OUT TO REF LAB; TAT 18-24 HRS): SARS-CoV-2, NAA: NOT DETECTED

## 2019-07-16 DIAGNOSIS — Z20828 Contact with and (suspected) exposure to other viral communicable diseases: Secondary | ICD-10-CM | POA: Diagnosis not present

## 2019-12-08 NOTE — Progress Notes (Signed)
[facility] [address], [address] [place], [place] [address] [phone]  Date:  12/09/2019   Name:  Lori Perry   DOB:  [birth year]   MRN:  [id number]  PCP:  Pearline Cables, MD    Chief Complaint: Finger Injury (smashed finger, middle right finger, happened two nights ago)   History of Present Illness:  Lori Perry is a 24 y.o. very pleasant female patient who presents with the following:  Generally healthy young woman here today with concern of a finger injury I last saw her in January of this year for physical-at that time she was working at [employer] as a Nature conservation officer, her son was 86 years old- he is now 5, she continues with her same employment History of ADD  COVID-19 series- encouraged her to do this  Pap- she does see GYN.  She is not sure of the last date of her pap  She was picking up a pallet at work and smashed her right long finger while at work 2 nights ago.   She worked last night but she had pain  It hurts to move the finger She is otherwise Leisure centre manager The patient asked her manager at work who told her to "see your doctor and find out what they say"-it does not seem that she was asked to see a particular Worker's Comp. Provider  Per patient no chance of current pregnancy Patient Active Problem List   Diagnosis Date Noted  . Vaginal delivery 10/15/2014  . 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 ADD (attention deficit disorder) 10/14/2014  . 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 Anemia 10/14/2014    Past Medical History:  Diagnosis Date  . Anemia   . Normal labor 10/14/2014    History reviewed. No pertinent surgical history.  Social History   Tobacco Use  . Smoking status: Never Smoker  . Smokeless tobacco: Never Used  Substance Use Topics  . Alcohol use: No    Alcohol/week: 0.0 standard drinks  . Drug use: No    Family History  Problem Relation Age of Onset  . Hypertension  Mother     Allergies  Allergen Reactions  . Nitrofurantoin Macrocrystal   . Amoxicillin Rash  . Sulfa Antibiotics Rash    Medication list has been reviewed and updated.  Current Outpatient Medications on File Prior to Visit  Medication Sig Dispense Refill  . docusate sodium (COLACE) 100 MG capsule Take 1 capsule (100 mg total) by mouth every 12 (twelve) hours. 60 capsule 0  . ibuprofen (ADVIL) 800 MG tablet Take 1 tablet (800 mg total) by mouth 3 (three) times daily. 21 tablet 0  . naproxen (NAPROSYN) 375 MG tablet Take 1 tablet (375 mg total) by mouth 2 (two) times daily with a meal. 10 tablet 0  . XULANE 150-35 MCG/24HR transdermal patch PLACE 1 PATCH ONTO THE SKIN ONCE A WEEK. USE FOR 3 WEEKS, THEN 1 WEEK OFF. PLEASE COME IN FOR A VISIT 12 patch 5  . [DISCONTINUED] omeprazole (PRILOSEC) 20 MG capsule Take 1 capsule (20 mg total) by mouth daily. 30 capsule 0   No current facility-administered medications on file prior to visit.    Review of Systems:  As per HPI- otherwise negative.   Physical Examination: Vitals:   12/09/19 1019  BP: 122/78  Pulse: 81  Resp: 16  SpO2: 99%   Vitals:   12/09/19 1019  Weight: 130 lb (59 kg)  Height: 5\' 6"  (1.676 m)   Body mass index is 20.98 kg/m. Ideal Body Weight: Weight in (lb) to have BMI = 25: 154.6  GEN: no acute distress.  Slim build, looks well HEENT: Atraumatic, Normocephalic.  Ears and Nose: No external deformity. CV: RRR, No M/G/R. No JVD. No thrill. No extra heart sounds. PULM: CTA B, no wheezes, crackles, rhonchi. No retractions. No resp. distress. No accessory muscle use. EXTR: No c/c/e PSYCH: Normally interactive. Conversant.  Her right wrist and metacarpals are normal and nontender, she notes tenderness in the right long finger at the proximal and middle phalanx and at the PIP joint.  Minimal swelling is present, no bruising She has pain with range of motion of the finger-range of motion is limited by pain but  strength of flexion extension seem intact  DG Hand Complete Right  Result Date: 12/09/2019 CLINICAL DATA:  Injured at work, dropped a pallet on RIGHT middle finger 2 days ago, pain EXAM: RIGHT HAND - COMPLETE 3VIEW COMPARISON:  None FINDINGS: Osseous mineralization normal. Joint spaces preserved. No acute fracture, dislocation, or bone destruction. IMPRESSION: No acute osseous abnormalities. Electronically Signed   By: 02/09/2020 M.D.   On: 12/09/2019 11:14   Placed pt in a fold-over splint that is not long enough to immobilize both the PIP and DIP joints but is all we had, and buddy taped to ring finger Assessment and Plan: Injury of finger of right hand, initial encounter - Plan: DG Hand Complete Right  Seen today with contusion of right long finger which occurred at her job 2 days ago.  X-rays negative for any fracture.  I placed her in a fold over splint and buddy taped as above.  Encouraged her to purchase a longer fold over splint which will allow her to forego buddy taping.  Encouraged her to work on gentle range of motion as her finger heals.  However, if not significantly better within 5 to 7 days please let me know.  Provide a note for her job to allow modification of her work duties as needed  Signed 02/09/2020, MD

## 2019-12-08 NOTE — Patient Instructions (Addendum)
It was great to see you again today!  No fracture seen, but you did bruise/ contuse your finger.  Please see if you can find a similar but longer "fold over" finger splint at the drug store and use it to protect your finger as needed, especially when you are at work.  You can "buddy tape" it to the 4th finger if you like.  If your finger is not better/ nearly better within 5-7 days please let me know- Sooner if worse.

## 2019-12-09 ENCOUNTER — Other Ambulatory Visit: Payer: Self-pay

## 2019-12-09 ENCOUNTER — Ambulatory Visit (INDEPENDENT_AMBULATORY_CARE_PROVIDER_SITE_OTHER): Payer: Managed Care, Other (non HMO) | Admitting: Family Medicine

## 2019-12-09 ENCOUNTER — Ambulatory Visit (HOSPITAL_BASED_OUTPATIENT_CLINIC_OR_DEPARTMENT_OTHER)
Admission: RE | Admit: 2019-12-09 | Discharge: 2019-12-09 | Disposition: A | Discharge status: Home / Self Care | Payer: Managed Care, Other (non HMO) | Source: Ambulatory Visit | Attending: Family Medicine | Admitting: Family Medicine

## 2019-12-09 ENCOUNTER — Encounter: Payer: Self-pay | Admitting: Family Medicine

## 2019-12-09 VITALS — BP 122/78 | HR 81 | Resp 16 | Ht 66.0 in | Wt 130.0 lb

## 2019-12-09 DIAGNOSIS — S6991XA Unspecified injury of right wrist, hand and finger(s), initial encounter: Secondary | ICD-10-CM

## 2020-01-26 ENCOUNTER — Other Ambulatory Visit: Payer: Self-pay | Admitting: Family Medicine

## 2020-01-26 MED FILL — XULANE PATCH: 150-35 | 84 days supply | Qty: 9 | Fill #0

## 2020-02-16 ENCOUNTER — Other Ambulatory Visit: Payer: Self-pay

## 2020-02-16 ENCOUNTER — Telehealth (INDEPENDENT_AMBULATORY_CARE_PROVIDER_SITE_OTHER): Payer: Managed Care, Other (non HMO) | Admitting: Medical

## 2020-02-16 DIAGNOSIS — J3489 Other specified disorders of nose and nasal sinuses: Secondary | ICD-10-CM | POA: Diagnosis not present

## 2020-02-16 DIAGNOSIS — J029 Acute pharyngitis, unspecified: Secondary | ICD-10-CM | POA: Diagnosis not present

## 2020-02-16 DIAGNOSIS — R0981 Nasal congestion: Secondary | ICD-10-CM

## 2020-02-16 MED ORDER — AZITHROMYCIN 250 MG PO TABS: ORAL_TABLET | ORAL | 0 refills | Status: DC

## 2020-02-16 MED ORDER — FLUTICASONE PROPIONATE 50 MCG/ACT NA SUSP: 2.0000 | Freq: Every day | NASAL | 1 refills | Status: DC

## 2020-02-16 MED ORDER — BENZONATATE 100 MG PO CAPS: 100.0000 mg | ORAL_CAPSULE | Freq: Three times a day (TID) | ORAL | 0 refills | Status: AC | PRN

## 2020-02-16 MED FILL — BENZONATATE 100 MG CAPS: 100 | 10 days supply | Qty: 30 | Fill #0

## 2020-02-16 MED FILL — FLUTICASONE PROP 50 MCG SPR: 50 | 30 days supply | Qty: 16 | Fill #0

## 2020-02-16 MED FILL — AZITHROMYCIN 250 MG TABLET: 250 | 5 days supply | Qty: 6 | Fill #0

## 2020-02-16 NOTE — Patient Instructions (Addendum)
Recent signs /symptom concerning for possible sinus infection and pharyngitis(possible strep).   Start zpack antibiotic, benonate tabs for cough and flonase for nasal congestion.  Return to work on Saturday provided clinically improved.  Explained benefit of covid testing. Can do test today and on Friday If + then would need to extend work note.  Follow up 7 days or as needed

## 2020-02-16 NOTE — Progress Notes (Signed)
   Subjective:    Patient ID: Lori Perry, female    DOB: 1995/06/26, 24 y.o.   MRN: [id number]  HPI  Virtual Visit via Video Note  I connected with Lori Perry on 02/16/20 at  2:40 PM EDT by a video enabled telemedicine application and verified that I am speaking with the correct person using two identifiers.  Location: Patient: home Provider: office. partciipitants- myself and pt.   I discussed the limitations of evaluation and management by telemedicine and the availability of in person appointments. The patient expressed understanding and agreed to proceed.  History of Present Illness: Since Sunday st, nasal congestion, dry cough and pnd. Some sinus pain on maxillary area. No colored mucus. No fever, no chills or sweats. Can smell and taste.   No sneezing, no itchy eye or runny nose. No wheezing  Pt not had covid vaccine.   Observations/Objective:  General-no acute distress, pleasant, oriented. Lungs- on inspection lungs appear unlabored. Neck- no tracheal deviation or jvd on inspection. Neuro- gross motor function appears intact. heent- maxillary sinus pressure on self palpation. Mild tonsil hypertrophy and redness.  Assessment and Plan: Recent signs /symptom concerning for possible sinus infection and pharyngitis(possible strep).   Start zpack antibiotic, benonate tabs for cough and flonase for nasal congestion.  Return to work on Saturday provided clinically improved.  Explained benefit of covid testing. Can do test today and on Friday If + then would need to extend work note.  Follow up 7 days or as needed  Follow Up Instructions:    I discussed the assessment and treatment plan with the patient. The patient was provided an opportunity to ask questions and all were answered. The patient agreed with the plan and demonstrated an understanding of the instructions.   The patient was advised to call back or seek an in-person evaluation if the symptoms  worsen or if the condition fails to improve as anticipated.  I provided 25 minutes of non-face-to-face time during this encounter.   Esperanza Richters, PA-C   Review of Systems     Objective:   Physical Exam        Assessment & Plan:

## 2020-05-12 ENCOUNTER — Telehealth (INDEPENDENT_AMBULATORY_CARE_PROVIDER_SITE_OTHER): Payer: Managed Care, Other (non HMO) | Admitting: Family Medicine

## 2020-05-12 DIAGNOSIS — J989 Respiratory disorder, unspecified: Secondary | ICD-10-CM | POA: Diagnosis not present

## 2020-05-12 NOTE — Progress Notes (Signed)
Virtual Visit via Telephone Note  I connected with Lori Perry on 05/12/20 at  4:40 PM EST by telephone and verified that I am speaking with the correct person using two identifiers.   I discussed the limitations, risks, security and privacy concerns of performing an evaluation and management service by telephone and the availability of in person appointments. I also discussed with the patient that there may be a patient responsible charge related to this service. The patient expressed understanding and agreed to proceed.  Location patient: home, [place] Location provider: work or home office Participants present for the call: patient, provider Patient did not have a visit with me in the prior 7 days to address this/these issue(s).   History of Present Illness:  Acute telemedicine visit for sinus congested: -Onset: last night -Symptoms include: sore throat, sinus headaches, nasal congestion -Denies:fevers, NVD, body aches, loss of taste or smell, SOB, known sick contacts - but is around a lot of people  -works at [employer], requesting workslip -Has tried: nothing -Pertinent past medical history: none significant -Pertinent medication allergies:sulfa, amoxicillin, macrobid -COVID-19 vaccine status:not vaccinated, did have flu shot   Observations/Objective: Patient sounds cheerful and well on the phone. I do not appreciate any SOB. Speech and thought processing are grossly intact. Patient reported vitals:  Assessment and Plan:  Respiratory illness  -we discussed possible serious and likely etiologies, options for evaluation and workup, limitations of telemedicine visit vs in person visit, treatment, treatment risks and precautions. Pt prefers to treat via telemedicine empirically rather than in person at this moment. Query VURI, COVID19 vs other. Discussed options for covid testing, treatment, potential complication and precautions. Advised to stay home while covid test results  are pending and until feeling better. Opted for symptomatic care with nasal saline, short course nasal decongestant and analgesic if needed. Work/School slipped offered: provided in patient instructions   Scheduled follow up with PCP offered: Agrees to follow-up if needed Advised to seek prompt in person care if worsening, new symptoms arise, or if is not improving with treatment. Advised of options for inperson care in case PCP office not available. Did let the patient know that I only do telemedicine shifts for  on Tuesdays and Thursdays and advised a follow up visit with PCP or at an [facility] if has further questions or concerns.   Follow Up Instructions:  I did not refer this patient for an OV with me in the next 24 hours for this/these issue(s).  I discussed the assessment and treatment plan with the patient. The patient was provided an opportunity to ask questions and all were answered. The patient agreed with the plan and demonstrated an understanding of the instructions.   I spent 13 minutes on this encounter.   Terressa Koyanagi, DO

## 2020-05-12 NOTE — Patient Instructions (Signed)
   ---------------------------------------------------------------------------------------------------------------------------      WORK SLIP:  Patient Lori Perry,  1995/08/03, was seen for a medical visit today, 05/12/20 . Please excuse from work according to the [facility] guidelines for a COVID like illness. We advise 10 days minimum from the onset of symptoms (05/11/2020) PLUS 1 day of no fever and improved symptoms. Will defer to employer for a sooner return to work if COVID19 testing is negative and the symptoms have resolved. Advise following CDC guidelines.    Sincerely: E-signature: Dr. Kriste Basque, DO [facility] Ph: [phone]   ------------------------------------------------------------------------------------------------------------------------------     HOME CARE TIPS:  Dolores Lory COVID19 testing information: [email or web address] [place] [id number] Most pharmacies also offer testing and home test kits.   -can use tylenol or aleve if needed for fevers, aches and pains per instructions  -can use nasal saline a few times per day if nasal congestion, sometime a short course of Afrin nasal spray for 3 days can help as well  -stay hydrated, drink plenty of fluids and eat small healthy meals - avoid dairy  -can take 1000 IU Vit D3 and Vit C lozenges per instructions  -If the Covid test is positive, check out the CDC website for more information on home care, transmission and treatment for COVID19  -follow up with your doctor in 2-3 days unless improving and feeling better  -stay home while sick, except to seek medical care, and if you have COVID19 please stay home for a full 10 days since the onset of symptoms PLUS one day of no fever and feeling better.  It was nice to meet you today, and I really hope you are feeling better soon. I help [facility] out with telemedicine visits on Tuesdays and Thursdays and am  available for visits on those days. If you have any concerns or questions following this visit please schedule a follow up visit with your Primary Care doctor or seek care at a local urgent care clinic to avoid delays in care.    Seek in person care promptly if your symptoms worsen, new concerns arise or you are not improving with treatment. Call 911 and/or seek emergency care if you symptoms are severe or life threatening.

## 2020-12-23 IMAGING — DX DG ABDOMEN ACUTE W/ 1V CHEST
3 series · 3 of 3 positions shown · non-contrast
Comparison: None.

CLINICAL DATA: Left upper quadrant abdominal and chest pain for 3
days.

EXAM:
DG ABDOMEN ACUTE W/ 1V CHEST

[chest pa]
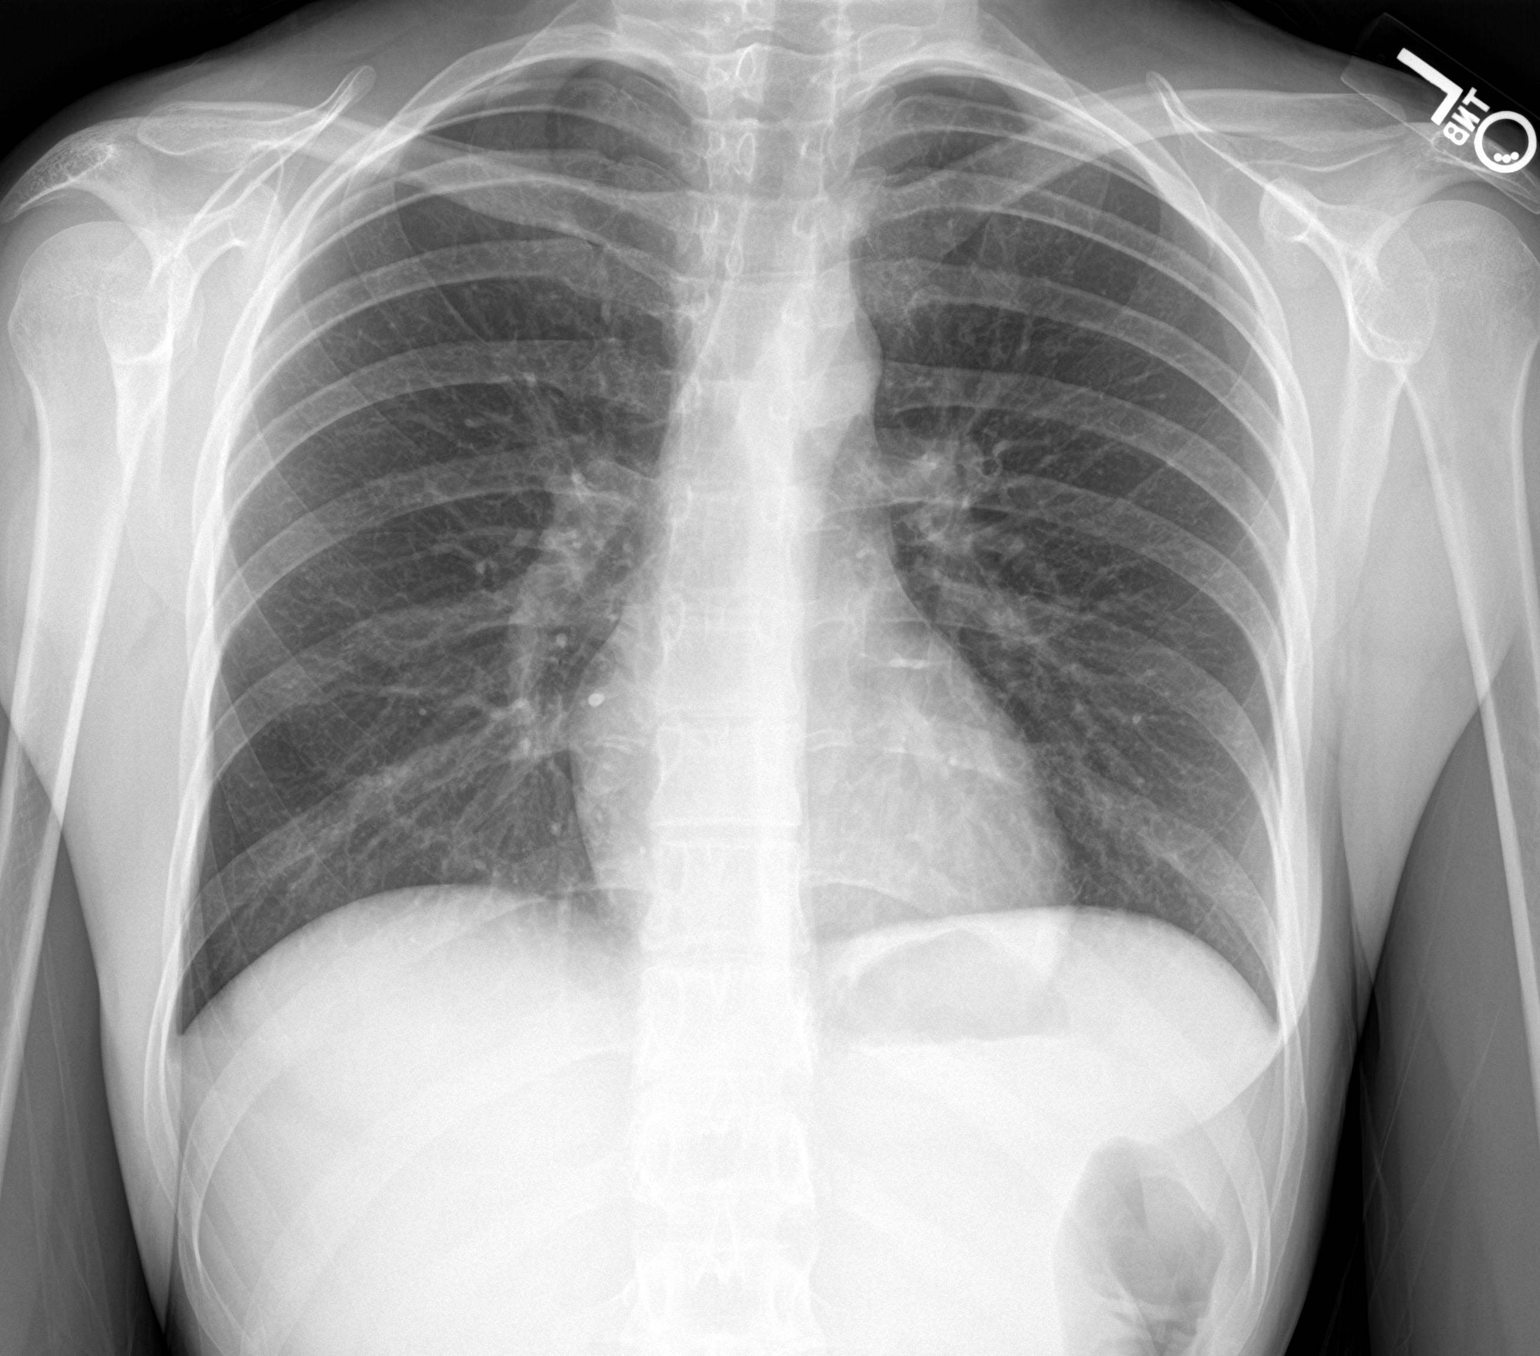

[abdomen erect]
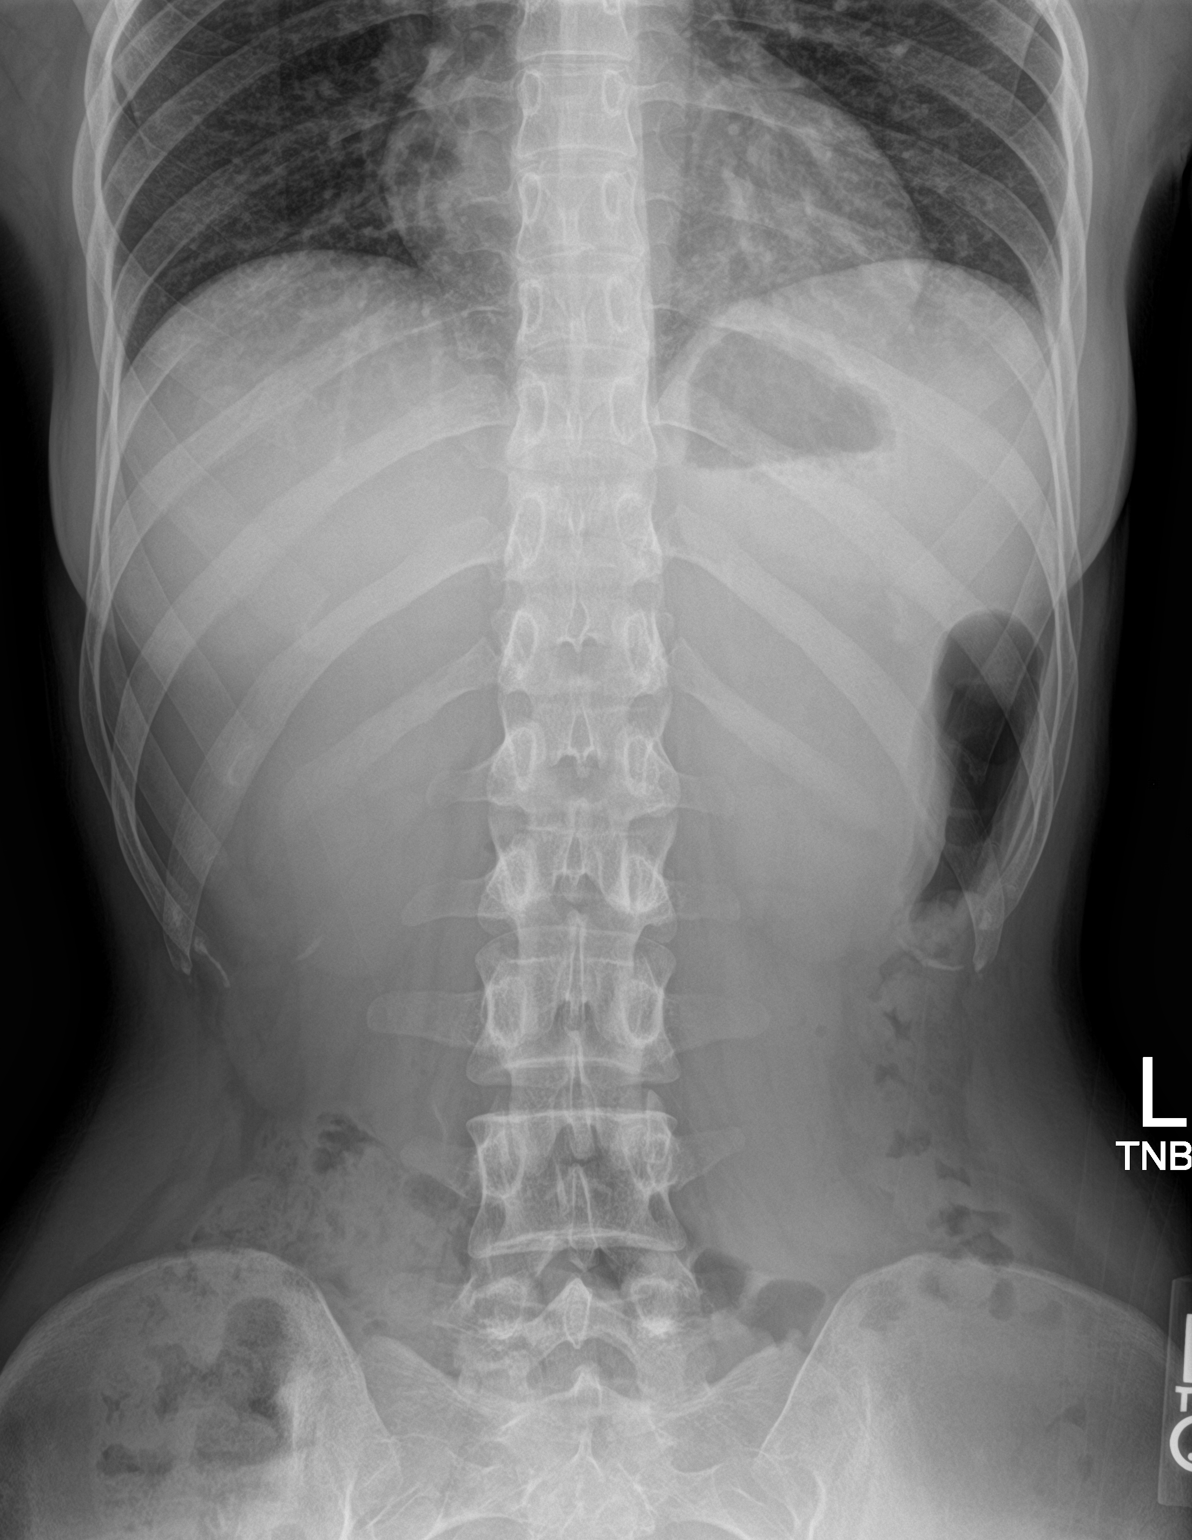

[abdomen kub]
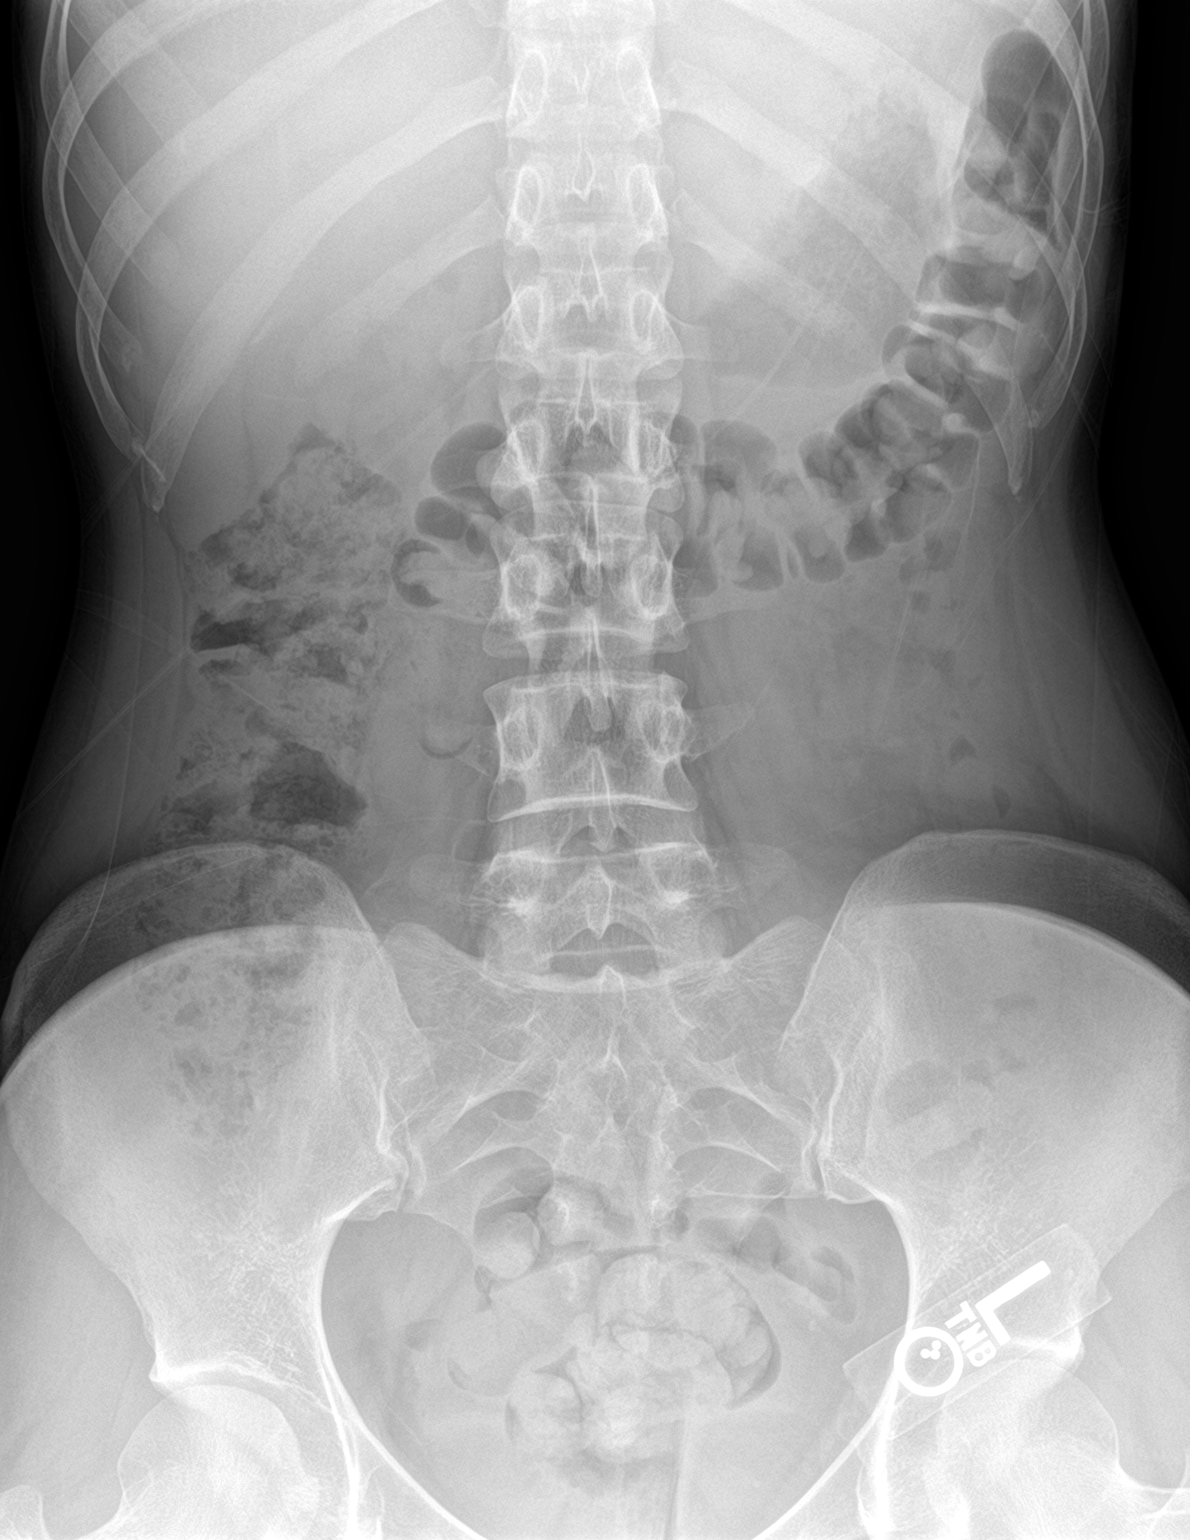

[3 of 3 positions shown; findings below may reference images not displayed]

FINDINGS: The heart size and mediastinal contours are normal. The lungs are
clear. There is no pleural effusion or pneumothorax. No acute
osseous findings are identified.

The bowel gas pattern is normal. There is no free intraperitoneal
air or suspicious abdominal calcification. Mildly prominent stool
noted in the right colon and rectum.
IMPRESSION: No active cardiopulmonary or abdominal process. Mildly prominent
stool in the right colon and rectum.

## 2021-03-20 NOTE — Patient Instructions (Addendum)
Good to see you again today!    Flu shot given today I do recommend getting the covid series and booster if not done yet Also check and see if you had the Gardasil/ HPV vaccine series yet.  I would recommend getting it if not done already

## 2021-03-20 NOTE — Progress Notes (Signed)
[facility] [address], [address] [place], [place] [address] [phone]  Date:  03/23/2021   Name:  TIMI REESER   DOB:  09-07-1995   MRN:  [id number]  PCP:  Pearline Cables, MD    Chief Complaint: Contraception   History of Present Illness:  GERARDO TERRITO is a 25 y.o. very pleasant female patient who presents with the following:  Pt seen today to follow-up on contraception  Last seen by myself July of 2021 She uses contraceptive ring, she is happy with this method   Her son is now 31 years old   HPV series- she is not sure if this is done, we cannot access [facility] right now Can offer STI screening Pap - this is done per her GYN.  She notes done less than 3 years ago  Labs can be updated - declines labs today   She is generally in good health  LMP is current   Patient Active Problem List   Diagnosis Date Noted   Vaginal delivery 10/15/2014   Rh negative, maternal 10/14/2014   ADD (attention deficit disorder) 10/14/2014   Allergy history, drug--Macrobid, Sulfa 10/14/2014   Anemia 10/14/2014    Past Medical History:  Diagnosis Date   Anemia    Normal labor 10/14/2014    No past surgical history on file.  Social History   Tobacco Use   Smoking status: Never   Smokeless tobacco: Never  Substance Use Topics   Alcohol use: No    Alcohol/week: 0.0 standard drinks   Drug use: No    Family History  Problem Relation Age of Onset   Hypertension Mother     Allergies  Allergen Reactions   Nitrofurantoin Macrocrystal    Amoxicillin Rash   Sulfa Antibiotics Rash    Medication list has been reviewed and updated.  Current Outpatient Medications on File Prior to Visit  Medication Sig Dispense Refill   XULANE 150-35 MCG/24HR transdermal patch PLACE 1 PATCH ONTO THE SKIN ONCE A WEEK. USE FOR 3 WEEKS, THEN TAKE 1 WEEK OFF. 9 patch 5   azithromycin (ZITHROMAX) 250 MG tablet Take 2 tablets by mouth on  day 1, followed by 1 tablet by mouth daily for 4 days. (Patient not taking: Reported on 03/23/2021) 6 tablet 0   benzonatate (TESSALON) 100 MG capsule Take 1 capsule (100 mg total) by mouth 3 (three) times daily as needed for cough. (Patient not taking: Reported on 03/23/2021) 30 capsule 0   docusate sodium (COLACE) 100 MG capsule Take 1 capsule (100 mg total) by mouth every 12 (twelve) hours. (Patient not taking: No sig reported) 60 capsule 0   fluticasone (FLONASE) 50 MCG/ACT nasal spray Place 2 sprays into both nostrils daily. (Patient not taking: Reported on 03/23/2021) 16 g 1   ibuprofen (ADVIL) 800 MG tablet Take 1 tablet (800 mg total) by mouth 3 (three) times daily. (Patient not taking: No sig reported) 21 tablet 0   naproxen (NAPROSYN) 375 MG tablet Take 1 tablet (375 mg total) by mouth 2 (two) times daily with a meal. (Patient not taking: Reported on 03/23/2021) 10 tablet 0   [DISCONTINUED] omeprazole (PRILOSEC) 20 MG capsule Take 1 capsule (20 mg total) by mouth daily. 30 capsule 0   No current facility-administered medications on file prior to visit.    Review of Systems:  As per HPI- otherwise negative.   Physical Examination: Vitals:   03/23/21 1305  BP: 110/62  Pulse: 90  Temp: 98.4 F (36.9 C)  SpO2: 98%   Vitals:   03/23/21 1305  Weight: 136 lb 4 oz (61.8 kg)  Height: 5\' 6"  (1.676 m)   Body mass index is 21.99 kg/m. Ideal Body Weight: Weight in (lb) to have BMI = 25: 154.6  GEN: no acute distress. Normal weight, looks well  HEENT: Atraumatic, Normocephalic.  Ears and Nose: No external deformity. CV: RRR, No M/G/R. No JVD. No thrill. No extra heart sounds. PULM: CTA B, no wheezes, crackles, rhonchi. No retractions. No resp. distress. No accessory muscle use. EXTR: No c/c/e PSYCH: Normally interactive. Conversant.    Assessment and Plan: Encounter for initial prescription of vaginal ring hormonal contraceptive - Plan: norelgestromin-ethinyl estradiol  ) 150-35 MCG/24HR transdermal patch Refilled contraceptive today- no concerns Flu shot given   Signed Burr Medico, MD

## 2021-03-23 ENCOUNTER — Other Ambulatory Visit (HOSPITAL_BASED_OUTPATIENT_CLINIC_OR_DEPARTMENT_OTHER): Payer: Self-pay

## 2021-03-23 ENCOUNTER — Ambulatory Visit (INDEPENDENT_AMBULATORY_CARE_PROVIDER_SITE_OTHER): Payer: Self-pay | Admitting: Family Medicine

## 2021-03-23 ENCOUNTER — Other Ambulatory Visit: Payer: Self-pay

## 2021-03-23 VITALS — BP 110/62 | HR 90 | Temp 98.4°F | Resp 16 | Ht 66.0 in | Wt 136.2 lb

## 2021-03-23 DIAGNOSIS — Z30015 Encounter for initial prescription of vaginal ring hormonal contraceptive: Secondary | ICD-10-CM

## 2021-03-23 DIAGNOSIS — Z23 Encounter for immunization: Secondary | ICD-10-CM

## 2021-03-23 MED ORDER — XULANE 150-35 MCG/24HR TD PTWK
MEDICATED_PATCH | TRANSDERMAL | 5 refills | Status: DC
  Filled 2021-03-23: qty 9, 84d supply, fill #0
  Filled 2021-11-15: qty 3, 28d supply, fill #0

## 2021-03-23 NOTE — Addendum Note (Signed)
Addended by: Mertha Finders on: 03/23/2021 01:33 PM   Modules accepted: Orders

## 2021-03-24 ENCOUNTER — Other Ambulatory Visit (HOSPITAL_BASED_OUTPATIENT_CLINIC_OR_DEPARTMENT_OTHER): Payer: Self-pay

## 2021-04-04 ENCOUNTER — Other Ambulatory Visit (HOSPITAL_BASED_OUTPATIENT_CLINIC_OR_DEPARTMENT_OTHER): Payer: Self-pay

## 2021-05-05 ENCOUNTER — Telehealth: Payer: Self-pay | Admitting: Medical

## 2021-05-05 ENCOUNTER — Ambulatory Visit (INDEPENDENT_AMBULATORY_CARE_PROVIDER_SITE_OTHER): Payer: Self-pay | Admitting: Medical

## 2021-05-05 ENCOUNTER — Other Ambulatory Visit (HOSPITAL_BASED_OUTPATIENT_CLINIC_OR_DEPARTMENT_OTHER): Payer: Self-pay

## 2021-05-05 VITALS — BP 107/63 | HR 100 | Temp 98.3°F | Resp 18 | Ht 66.0 in | Wt 132.6 lb

## 2021-05-05 DIAGNOSIS — J3489 Other specified disorders of nose and nasal sinuses: Secondary | ICD-10-CM

## 2021-05-05 DIAGNOSIS — R0981 Nasal congestion: Secondary | ICD-10-CM

## 2021-05-05 DIAGNOSIS — R5383 Other fatigue: Secondary | ICD-10-CM

## 2021-05-05 DIAGNOSIS — J0100 Acute maxillary sinusitis, unspecified: Secondary | ICD-10-CM

## 2021-05-05 LAB — CBC WITH DIFFERENTIAL/PLATELET
Basophils Absolute: 0 10*3/uL (ref 0.0–0.1)
Basophils Relative: 0.7 % (ref 0.0–3.0)
Eosinophils Absolute: 0.1 10*3/uL (ref 0.0–0.7)
Eosinophils Relative: 1.4 % (ref 0.0–5.0)
HCT: 40.4 % (ref 36.0–46.0)
Hemoglobin: 13.3 g/dL (ref 12.0–15.0)
Lymphocytes Relative: 28.6 % (ref 12.0–46.0)
Lymphs Abs: 1.3 10*3/uL (ref 0.7–4.0)
MCHC: 32.8 g/dL (ref 30.0–36.0)
MCV: 84.6 fl (ref 78.0–100.0)
Monocytes Absolute: 0.4 10*3/uL (ref 0.1–1.0)
Monocytes Relative: 8.9 % (ref 3.0–12.0)
Neutro Abs: 2.7 10*3/uL (ref 1.4–7.7)
Neutrophils Relative %: 60.4 % (ref 43.0–77.0)
Platelets: 242 10*3/uL (ref 150.0–400.0)
RBC: 4.78 Mil/uL (ref 3.87–5.11)
RDW: 13.2 % (ref 11.5–15.5)
WBC: 4.6 10*3/uL (ref 4.0–10.5)

## 2021-05-05 LAB — COMPREHENSIVE METABOLIC PANEL
ALT: 10 U/L (ref 0–35)
AST: 14 U/L (ref 0–37)
Albumin: 4.6 g/dL (ref 3.5–5.2)
Alkaline Phosphatase: 56 U/L (ref 39–117)
BUN: 11 mg/dL (ref 6–23)
CO2: 27 mEq/L (ref 19–32)
Calcium: 9.3 mg/dL (ref 8.4–10.5)
Chloride: 104 mEq/L (ref 96–112)
Creatinine, Ser: 0.76 mg/dL (ref 0.40–1.20)
GFR: 107.77 mL/min (ref >60.00)
Glucose, Bld: 93 mg/dL (ref 70–99)
Potassium: 4.3 mEq/L (ref 3.5–5.1)
Sodium: 139 mEq/L (ref 135–145)
Total Bilirubin: 0.4 mg/dL (ref 0.2–1.2)
Total Protein: 7.5 g/dL (ref 6.0–8.3)

## 2021-05-05 LAB — IRON: Iron: 98 ug/dL (ref 42–145)

## 2021-05-05 LAB — POC COVID19 BINAXNOW: SARS Coronavirus 2 Ag: POSITIVE — AB

## 2021-05-05 LAB — VITAMIN B12: Vitamin B-12: 422 pg/mL (ref 211–911)

## 2021-05-05 LAB — TSH: TSH: 0.58 u[IU]/mL (ref 0.35–5.50)

## 2021-05-05 MED ORDER — FLUTICASONE PROPIONATE 50 MCG/ACT NA SUSP
2.0000 | Freq: Every day | NASAL | 1 refills | Status: DC
  Filled 2021-05-05: qty 16, 30d supply, fill #0
  Filled 2021-11-15: qty 16, 30d supply, fill #1

## 2021-05-05 MED ORDER — AZITHROMYCIN 250 MG PO TABS
ORAL_TABLET | ORAL | 0 refills | Status: AC
  Filled 2021-05-05: qty 6, 5d supply, fill #0

## 2021-05-05 NOTE — Progress Notes (Signed)
Subjective:    Patient ID: Lori Perry, female    DOB: 1995/12/14, 25 y.o.   MRN: [id number]  HPI  Pt since Friday night got sinus pressure, nasal congestion and mild sneezing. No cough. Pt states hx of sinus infection. She gets those easily. No fever, no chills or sweats. No myaglias or joint pains.   Over the weekend felt fatigued and light headed. She states hx of anemia.   Lmp-within last month. Can't remember exact date. States she can't be pregnant.   Review of Systems  Constitutional:  Positive for fatigue. Negative for chills and fever.  HENT:  Positive for congestion, sinus pressure and sinus pain. Negative for ear pain.   Respiratory:  Negative for cough, chest tightness, shortness of breath and wheezing.   Cardiovascular:  Negative for chest pain and palpitations.  Gastrointestinal:  Negative for abdominal pain, constipation and nausea.  Genitourinary:  Negative for dysuria, frequency and urgency.  Musculoskeletal:  Negative for back pain, gait problem and joint swelling.  Skin:  Negative for rash.  Neurological:  Negative for dizziness, tremors, seizures and headaches.  Hematological:  Negative for adenopathy. Does not bruise/bleed easily.  Psychiatric/Behavioral:  Negative for behavioral problems and confusion. The patient is not nervous/anxious.    Past Medical History:  Diagnosis Date   Anemia    Normal labor 10/14/2014     Social History   Socioeconomic History   Marital status: Single    Spouse name: Not on file   Number of children: Not on file   Years of education: Not on file   Highest education level: Not on file  Occupational History   Not on file  Tobacco Use   Smoking status: Never   Smokeless tobacco: Never  Substance and Sexual Activity   Alcohol use: No    Alcohol/week: 0.0 standard drinks   Drug use: No   Sexual activity: Yes    Birth control/protection: Patch  Other Topics Concern   Not on file  Social History Narrative   Not on  file   Social Determinants of Health   Financial Resource Strain: Not on file  Food Insecurity: Not on file  Transportation Needs: Not on file  Physical Activity: Not on file  Stress: Not on file  Social Connections: Not on file  Intimate Partner Violence: Not on file    No past surgical history on file.  Family History  Problem Relation Age of Onset   Hypertension Mother     Allergies  Allergen Reactions   Nitrofurantoin Macrocrystal    Amoxicillin Rash   Sulfa Antibiotics Rash    Current Outpatient Medications on File Prior to Visit  Medication Sig Dispense Refill   azithromycin (ZITHROMAX) 250 MG tablet Take 2 tablets by mouth on day 1, followed by 1 tablet by mouth daily for 4 days. 6 tablet 0   benzonatate (TESSALON) 100 MG capsule Take 1 capsule (100 mg total) by mouth 3 (three) times daily as needed for cough. 30 capsule 0   docusate sodium (COLACE) 100 MG capsule Take 1 capsule (100 mg total) by mouth every 12 (twelve) hours. 60 capsule 0   fluticasone (FLONASE) 50 MCG/ACT nasal spray Place 2 sprays into both nostrils daily. 16 g 1   ibuprofen (ADVIL) 800 MG tablet Take 1 tablet (800 mg total) by mouth 3 (three) times daily. 21 tablet 0   naproxen (NAPROSYN) 375 MG tablet Take 1 tablet (375 mg total) by mouth 2 (two) times daily with  a meal. 10 tablet 0   norelgestromin-ethinyl estradiol (XULANE) 150-35 MCG/24HR transdermal patch PLACE 1 PATCH ONTO THE SKIN ONCE A WEEK. USE FOR 3 WEEKS, THEN TAKE 1 WEEK OFF. 9 patch 5   [DISCONTINUED] omeprazole (PRILOSEC) 20 MG capsule Take 1 capsule (20 mg total) by mouth daily. 30 capsule 0   No current facility-administered medications on file prior to visit.    BP 107/63   Pulse 100   Temp 98.3 F (36.8 C)   Resp 18   Ht 5\' 6"  (1.676 m)   Wt 132 lb 9.6 oz (60.1 kg)   SpO2 100%   BMI 21.40 kg/m        Objective:   Physical Exam  General- No acute distress. Pleasant patient. Neck- Full range of motion, no  jvd Lungs- Clear, even and unlabored. Heart- regular rate and rhythm. Neurologic- CNII- XII grossly intact.  Heent- frontal and macxillary sinus pressure.      Assessment & Plan:   Patient Instructions  Sinus infection. I am prescribing  azithromycin antibiotic and flonase nasal spray for congestion. If you get cough can rx cough med.  Fatigue likely related to above but you not hx of anemia and low iron. So added fatigue labs.  Rest, hydrate, tylenol for fever.  Follow up in 7 days or as needed.    , PA-C

## 2021-05-05 NOTE — Addendum Note (Signed)
Addended by: Maximino Sarin on: 05/05/2021 10:42 AM   Modules accepted: Orders

## 2021-05-05 NOTE — Patient Instructions (Signed)
Sinus infection. I am prescribing  azithromycin antibiotic and flonase nasal spray for congestion. If you get cough can rx cough med.  Fatigue likely related to above but you not hx of anemia and low iron. So added fatigue labs.  Rest, hydrate, tylenol for fever.  Follow up in 7 days or as needed.

## 2021-05-05 NOTE — Telephone Encounter (Signed)
Notified pt by phone covid +. Send work note to return on 12-01-03-21 if feeling better and no fever. Wear mask for 5 days after return to work.

## 2021-10-02 ENCOUNTER — Encounter (INDEPENDENT_AMBULATORY_CARE_PROVIDER_SITE_OTHER): Payer: Self-pay

## 2021-11-03 ENCOUNTER — Encounter (INDEPENDENT_AMBULATORY_CARE_PROVIDER_SITE_OTHER): Payer: Self-pay

## 2021-11-15 ENCOUNTER — Other Ambulatory Visit (HOSPITAL_BASED_OUTPATIENT_CLINIC_OR_DEPARTMENT_OTHER): Payer: Self-pay

## 2022-04-26 ENCOUNTER — Other Ambulatory Visit: Payer: Self-pay | Admitting: Family Medicine

## 2022-04-26 ENCOUNTER — Other Ambulatory Visit: Payer: Self-pay | Admitting: Medical

## 2022-04-26 DIAGNOSIS — Z30015 Encounter for initial prescription of vaginal ring hormonal contraceptive: Secondary | ICD-10-CM

## 2022-04-28 ENCOUNTER — Other Ambulatory Visit (HOSPITAL_BASED_OUTPATIENT_CLINIC_OR_DEPARTMENT_OTHER): Payer: Self-pay

## 2022-04-28 MED ORDER — FLUTICASONE PROPIONATE 50 MCG/ACT NA SUSP
2.0000 | Freq: Every day | NASAL | 1 refills | Status: AC
  Filled 2022-04-28: qty 16, 30d supply, fill #0

## 2022-04-28 MED ORDER — XULANE 150-35 MCG/24HR TD PTWK
MEDICATED_PATCH | TRANSDERMAL | 5 refills | Status: AC
  Filled 2022-04-28: qty 3, 28d supply, fill #0

## 2022-04-30 ENCOUNTER — Other Ambulatory Visit (HOSPITAL_BASED_OUTPATIENT_CLINIC_OR_DEPARTMENT_OTHER): Payer: Self-pay

## 2022-05-08 ENCOUNTER — Other Ambulatory Visit (HOSPITAL_BASED_OUTPATIENT_CLINIC_OR_DEPARTMENT_OTHER): Payer: Self-pay
